# Patient Record
Sex: Female | Born: 1990 | Race: Black or African American | Hispanic: No | Marital: Married | Smoking: Never smoker
Health system: Southern US, Community
[De-identification: ages and names within clinical notes are randomized; demographics above are authoritative.]

## PROBLEM LIST (undated history)

## (undated) ENCOUNTER — Inpatient Hospital Stay (HOSPITAL_COMMUNITY): Payer: Self-pay

## (undated) DIAGNOSIS — IMO0002 Reserved for concepts with insufficient information to code with codable children: Secondary | ICD-10-CM

## (undated) DIAGNOSIS — L709 Acne, unspecified: Secondary | ICD-10-CM

## (undated) DIAGNOSIS — Z789 Other specified health status: Secondary | ICD-10-CM

## (undated) HISTORY — DX: Reserved for concepts with insufficient information to code with codable children: IMO0002

## (undated) HISTORY — DX: Other specified health status: Z78.9

## (undated) HISTORY — PX: NO PAST SURGERIES: SHX2092

---

## 2012-01-12 NOTE — L&D Delivery Note (Signed)
Delivery Note SROM @ 1520 w/ cervical FB still intact, at that time RN checked around balloon and was 3-4cm.  Pt decided to get an epidural ~1715, and at 1741 FB fell out, pt was ant lip and +2. At 1800 she was 10/100/+@ w/ strong urge to push, and at 6:37 PM a viable female was delivered via Vaginal, Spontaneous Delivery (Presentation: Right Occiput Anterior).  APGAR: 9, 9; weight 4 lb 13.8 oz (2206 g).   Placenta status: Intact, Spontaneous, small, to Pathology d/t IUGR.  Cord: 3 vessels with the following complications: None.   Uterus initially boggy, firmed up w/ massage.   Anesthesia: Epidural  Episiotomy: None Lacerations: 2nd degree Suture Repair: 3.0 vicryl Est. Blood Loss (mL): 400  Mom to postpartum.  Baby to Couplet care / Skin to Skin. Breastfeeding, undecided about contraception, plans OP circumcision  Marge Duncans 11/25/2012, 7:09 PM

## 2012-05-10 ENCOUNTER — Ambulatory Visit: Payer: Self-pay | Admitting: Family Medicine

## 2012-05-10 VITALS — BP 108/70 | HR 92 | Temp 98.5°F | Resp 16 | Ht 63.0 in | Wt 121.8 lb

## 2012-05-10 DIAGNOSIS — Z3201 Encounter for pregnancy test, result positive: Secondary | ICD-10-CM

## 2012-05-10 LAB — POCT URINE PREGNANCY: Preg Test, Ur: POSITIVE

## 2012-05-10 MED ORDER — PRENATAL VITAMINS (DIS) PO TABS: 1.0000 | ORAL_TABLET | Freq: Every morning | ORAL | Status: DC

## 2012-05-10 MED ORDER — PROMETHAZINE HCL 12.5 MG PO TABS: 12.5000 mg | ORAL_TABLET | Freq: Three times a day (TID) | ORAL | Status: DC | PRN

## 2012-05-10 NOTE — Patient Instructions (Addendum)

## 2012-05-10 NOTE — Progress Notes (Signed)
22 yo woman from [place] whose last period was February 28th.  She had a positive home pregnancy test. She has had morning sickness for two weeks.  Objective:  NAD  We discussed going to [facility] as an uninsured patient  Pregnancy test positive - Plan: POCT urine pregnancy, promethazine (PHENERGAN) 12.5 MG tablet, Prenatal Vitamins (DIS) TABS, DISCONTINUED: promethazine (PHENERGAN) 12.5 MG tablet, DISCONTINUED: Prenatal Vitamins (DIS) TABS

## 2012-05-29 LAB — OB RESULTS CONSOLE GC/CHLAMYDIA: Gonorrhea: NEGATIVE

## 2012-05-29 LAB — OB RESULTS CONSOLE RPR: RPR: NONREACTIVE

## 2012-05-29 LAB — OB RESULTS CONSOLE RUBELLA ANTIBODY, IGM: Rubella: IMMUNE

## 2012-05-29 LAB — OB RESULTS CONSOLE HIV ANTIBODY (ROUTINE TESTING): HIV: NONREACTIVE

## 2012-05-29 LAB — OB RESULTS CONSOLE ABO/RH

## 2012-05-30 ENCOUNTER — Other Ambulatory Visit: Payer: Self-pay | Admitting: Family

## 2012-05-30 DIAGNOSIS — N6324 Unspecified lump in the left breast, lower inner quadrant: Secondary | ICD-10-CM

## 2012-06-02 ENCOUNTER — Ambulatory Visit
Admission: RE | Admit: 2012-06-02 | Discharge: 2012-06-02 | Disposition: A | Discharge status: Home / Self Care | Payer: No Typology Code available for payment source | Source: Ambulatory Visit | Attending: Family | Admitting: Family

## 2012-06-02 ENCOUNTER — Other Ambulatory Visit: Payer: Self-pay | Admitting: Family

## 2012-06-02 DIAGNOSIS — N6324 Unspecified lump in the left breast, lower inner quadrant: Secondary | ICD-10-CM

## 2012-06-02 DIAGNOSIS — N63 Unspecified lump in unspecified breast: Secondary | ICD-10-CM

## 2012-07-12 ENCOUNTER — Other Ambulatory Visit (HOSPITAL_COMMUNITY): Payer: Self-pay | Admitting: Physician Assistant

## 2012-07-12 DIAGNOSIS — Z0489 Encounter for examination and observation for other specified reasons: Secondary | ICD-10-CM

## 2012-07-18 ENCOUNTER — Ambulatory Visit (HOSPITAL_COMMUNITY)
Admission: RE | Admit: 2012-07-18 | Discharge: 2012-07-18 | Disposition: A | Discharge status: Home / Self Care | Payer: Medicaid Other | Source: Ambulatory Visit | Attending: Physician Assistant | Admitting: Physician Assistant

## 2012-07-18 DIAGNOSIS — Z3689 Encounter for other specified antenatal screening: Secondary | ICD-10-CM | POA: Insufficient documentation

## 2012-07-18 DIAGNOSIS — Z0489 Encounter for examination and observation for other specified reasons: Secondary | ICD-10-CM

## 2012-09-06 ENCOUNTER — Other Ambulatory Visit (HOSPITAL_COMMUNITY): Payer: Self-pay | Admitting: Physician Assistant

## 2012-09-06 DIAGNOSIS — O288 Other abnormal findings on antenatal screening of mother: Secondary | ICD-10-CM

## 2012-09-06 DIAGNOSIS — Z364 Encounter for antenatal screening for fetal growth retardation: Secondary | ICD-10-CM

## 2012-09-08 ENCOUNTER — Ambulatory Visit (HOSPITAL_COMMUNITY)
Admission: RE | Admit: 2012-09-08 | Discharge: 2012-09-08 | Disposition: A | Discharge status: Home / Self Care | Payer: Medicaid Other | Source: Ambulatory Visit | Attending: Physician Assistant | Admitting: Physician Assistant

## 2012-09-08 DIAGNOSIS — Z3689 Encounter for other specified antenatal screening: Secondary | ICD-10-CM | POA: Insufficient documentation

## 2012-09-08 DIAGNOSIS — Z364 Encounter for antenatal screening for fetal growth retardation: Secondary | ICD-10-CM

## 2012-09-08 DIAGNOSIS — O288 Other abnormal findings on antenatal screening of mother: Secondary | ICD-10-CM

## 2012-09-08 DIAGNOSIS — O26849 Uterine size-date discrepancy, unspecified trimester: Secondary | ICD-10-CM | POA: Insufficient documentation

## 2012-09-13 ENCOUNTER — Other Ambulatory Visit (HOSPITAL_COMMUNITY): Payer: Self-pay | Admitting: Physician Assistant

## 2012-09-13 DIAGNOSIS — Z1389 Encounter for screening for other disorder: Secondary | ICD-10-CM

## 2012-09-13 DIAGNOSIS — O26849 Uterine size-date discrepancy, unspecified trimester: Secondary | ICD-10-CM

## 2012-09-28 LAB — OB RESULTS CONSOLE RPR: RPR: NONREACTIVE

## 2012-09-28 LAB — OB RESULTS CONSOLE HGB/HCT, BLOOD
HCT: 30 %
Hemoglobin: 9.9 g/dL

## 2012-09-28 LAB — OB RESULTS CONSOLE HIV ANTIBODY (ROUTINE TESTING): HIV: NONREACTIVE

## 2012-09-28 LAB — GLUCOSE TOLERANCE, 1 HOUR (50G) W/O FASTING: Glucose, 1 Hour GTT: 151

## 2012-09-29 LAB — GLUCOSE TOLERANCE, 3 HOURS
Glucose, GTT - 3 Hour: 120 mg/dL (ref ?–140)
Glucose, GTT - Fasting: 84 mg/dL (ref 80–110)

## 2012-10-06 ENCOUNTER — Other Ambulatory Visit (HOSPITAL_COMMUNITY): Payer: Self-pay | Admitting: Physician Assistant

## 2012-10-06 ENCOUNTER — Ambulatory Visit (HOSPITAL_COMMUNITY)
Admission: RE | Admit: 2012-10-06 | Discharge: 2012-10-06 | Disposition: A | Discharge status: Home / Self Care | Payer: Medicaid Other | Source: Ambulatory Visit | Attending: Physician Assistant | Admitting: Physician Assistant

## 2012-10-06 ENCOUNTER — Ambulatory Visit (HOSPITAL_COMMUNITY)
Admission: RE | Admit: 2012-10-06 | Payer: Medicaid Other | Source: Ambulatory Visit | Attending: Obstetrics & Gynecology | Admitting: Obstetrics & Gynecology

## 2012-10-06 DIAGNOSIS — Z1389 Encounter for screening for other disorder: Secondary | ICD-10-CM

## 2012-10-06 DIAGNOSIS — O26849 Uterine size-date discrepancy, unspecified trimester: Secondary | ICD-10-CM | POA: Insufficient documentation

## 2012-10-12 ENCOUNTER — Other Ambulatory Visit (HOSPITAL_COMMUNITY): Payer: Self-pay | Admitting: Physician Assistant

## 2012-10-12 DIAGNOSIS — O26849 Uterine size-date discrepancy, unspecified trimester: Secondary | ICD-10-CM

## 2012-10-27 ENCOUNTER — Encounter (HOSPITAL_COMMUNITY): Payer: Self-pay

## 2012-10-27 ENCOUNTER — Ambulatory Visit (HOSPITAL_COMMUNITY)
Admission: RE | Admit: 2012-10-27 | Discharge: 2012-10-27 | Disposition: A | Discharge status: Home / Self Care | Payer: Medicaid Other | Source: Ambulatory Visit | Attending: Physician Assistant | Admitting: Physician Assistant

## 2012-10-27 ENCOUNTER — Other Ambulatory Visit (HOSPITAL_COMMUNITY): Payer: Self-pay | Admitting: Physician Assistant

## 2012-10-27 DIAGNOSIS — O26849 Uterine size-date discrepancy, unspecified trimester: Secondary | ICD-10-CM | POA: Insufficient documentation

## 2012-10-30 ENCOUNTER — Other Ambulatory Visit (HOSPITAL_COMMUNITY): Payer: Self-pay | Admitting: Physician Assistant

## 2012-10-30 ENCOUNTER — Ambulatory Visit (HOSPITAL_COMMUNITY): Admission: RE | Admit: 2012-10-30 | Payer: Medicaid Other | Source: Ambulatory Visit

## 2012-10-30 ENCOUNTER — Ambulatory Visit (HOSPITAL_COMMUNITY)
Admission: RE | Admit: 2012-10-30 | Discharge: 2012-10-30 | Disposition: A | Discharge status: Home / Self Care | Payer: Medicaid Other | Source: Ambulatory Visit | Attending: Physician Assistant | Admitting: Physician Assistant

## 2012-10-30 VITALS — BP 114/68 | HR 98 | Wt 143.0 lb

## 2012-10-30 DIAGNOSIS — Z3689 Encounter for other specified antenatal screening: Secondary | ICD-10-CM | POA: Insufficient documentation

## 2012-10-30 DIAGNOSIS — O26849 Uterine size-date discrepancy, unspecified trimester: Secondary | ICD-10-CM

## 2012-10-30 NOTE — Progress Notes (Signed)
Shelley Bishop  was seen today for an ultrasound appointment.  See full report in [facility].  Impression: Single IUP at 33 3/7  Suspected fetal growth restriction (EFW < 10th %tile at last visit) Active fetus with BPP of 8/8 Normal amniotic fluid volume  Recommendations: Continue 2x weekly NSTs with weekly AFI, UA Dopplers Follow up growth scan in 2-3 weeks.  Alpha Gula, MD

## 2012-10-31 ENCOUNTER — Other Ambulatory Visit (HOSPITAL_COMMUNITY): Payer: Self-pay | Admitting: Physician Assistant

## 2012-10-31 DIAGNOSIS — IMO0002 Reserved for concepts with insufficient information to code with codable children: Secondary | ICD-10-CM

## 2012-10-31 NOTE — Addendum Note (Signed)
Encounter addended by: Alessandra Bevels. Chase Picket, RN on: 10/31/2012  4:42 PM<BR>     Documentation filed: Charges VN, Episodes, Chief Complaint Section

## 2012-11-02 ENCOUNTER — Ambulatory Visit (HOSPITAL_COMMUNITY)
Admission: RE | Admit: 2012-11-02 | Discharge: 2012-11-02 | Disposition: A | Discharge status: Home / Self Care | Payer: Medicaid Other | Source: Ambulatory Visit | Attending: Physician Assistant | Admitting: Physician Assistant

## 2012-11-02 DIAGNOSIS — O365990 Maternal care for other known or suspected poor fetal growth, unspecified trimester, not applicable or unspecified: Secondary | ICD-10-CM | POA: Insufficient documentation

## 2012-11-02 DIAGNOSIS — Z3689 Encounter for other specified antenatal screening: Secondary | ICD-10-CM | POA: Insufficient documentation

## 2012-11-02 DIAGNOSIS — IMO0002 Reserved for concepts with insufficient information to code with codable children: Secondary | ICD-10-CM

## 2012-11-06 ENCOUNTER — Ambulatory Visit (HOSPITAL_COMMUNITY)
Admission: RE | Admit: 2012-11-06 | Discharge: 2012-11-06 | Disposition: A | Discharge status: Home / Self Care | Payer: Medicaid Other | Source: Ambulatory Visit | Attending: Physician Assistant | Admitting: Physician Assistant

## 2012-11-06 ENCOUNTER — Other Ambulatory Visit (HOSPITAL_COMMUNITY): Payer: Self-pay | Admitting: Physician Assistant

## 2012-11-06 ENCOUNTER — Ambulatory Visit (HOSPITAL_COMMUNITY): Admission: RE | Admit: 2012-11-06 | Payer: Medicaid Other | Source: Ambulatory Visit

## 2012-11-06 VITALS — BP 117/6 | HR 104 | Wt 146.0 lb

## 2012-11-06 DIAGNOSIS — Z3689 Encounter for other specified antenatal screening: Secondary | ICD-10-CM | POA: Insufficient documentation

## 2012-11-06 DIAGNOSIS — O0990 Supervision of high risk pregnancy, unspecified, unspecified trimester: Secondary | ICD-10-CM

## 2012-11-06 DIAGNOSIS — O365921 Maternal care for other known or suspected poor fetal growth, second trimester, fetus 1: Secondary | ICD-10-CM

## 2012-11-06 DIAGNOSIS — O365990 Maternal care for other known or suspected poor fetal growth, unspecified trimester, not applicable or unspecified: Secondary | ICD-10-CM | POA: Insufficient documentation

## 2012-11-07 ENCOUNTER — Other Ambulatory Visit (HOSPITAL_COMMUNITY): Payer: Self-pay | Admitting: Physician Assistant

## 2012-11-07 DIAGNOSIS — IMO0002 Reserved for concepts with insufficient information to code with codable children: Secondary | ICD-10-CM

## 2012-11-09 ENCOUNTER — Ambulatory Visit (HOSPITAL_COMMUNITY)
Admission: RE | Admit: 2012-11-09 | Discharge: 2012-11-09 | Disposition: A | Discharge status: Home / Self Care | Payer: Medicaid Other | Source: Ambulatory Visit | Attending: Physician Assistant | Admitting: Physician Assistant

## 2012-11-09 DIAGNOSIS — O365990 Maternal care for other known or suspected poor fetal growth, unspecified trimester, not applicable or unspecified: Secondary | ICD-10-CM | POA: Insufficient documentation

## 2012-11-09 DIAGNOSIS — IMO0002 Reserved for concepts with insufficient information to code with codable children: Secondary | ICD-10-CM

## 2012-11-13 ENCOUNTER — Ambulatory Visit (HOSPITAL_COMMUNITY)
Admission: RE | Admit: 2012-11-13 | Discharge: 2012-11-13 | Disposition: A | Discharge status: Home / Self Care | Payer: Medicaid Other | Source: Ambulatory Visit | Attending: Obstetrics & Gynecology | Admitting: Obstetrics & Gynecology

## 2012-11-13 ENCOUNTER — Encounter: Payer: Medicaid Other | Admitting: Obstetrics & Gynecology

## 2012-11-13 DIAGNOSIS — O365990 Maternal care for other known or suspected poor fetal growth, unspecified trimester, not applicable or unspecified: Secondary | ICD-10-CM | POA: Insufficient documentation

## 2012-11-13 DIAGNOSIS — IMO0002 Reserved for concepts with insufficient information to code with codable children: Secondary | ICD-10-CM

## 2012-11-16 ENCOUNTER — Other Ambulatory Visit (HOSPITAL_COMMUNITY): Payer: Self-pay | Admitting: Physician Assistant

## 2012-11-16 ENCOUNTER — Ambulatory Visit (HOSPITAL_COMMUNITY)
Admission: RE | Admit: 2012-11-16 | Discharge: 2012-11-16 | Disposition: A | Discharge status: Home / Self Care | Payer: Medicaid Other | Source: Ambulatory Visit | Attending: Obstetrics & Gynecology | Admitting: Obstetrics & Gynecology

## 2012-11-16 ENCOUNTER — Ambulatory Visit (HOSPITAL_COMMUNITY): Admission: RE | Admit: 2012-11-16 | Payer: Medicaid Other | Source: Ambulatory Visit

## 2012-11-16 ENCOUNTER — Other Ambulatory Visit (HOSPITAL_COMMUNITY): Payer: Self-pay | Admitting: Maternal and Fetal Medicine

## 2012-11-16 DIAGNOSIS — O365990 Maternal care for other known or suspected poor fetal growth, unspecified trimester, not applicable or unspecified: Secondary | ICD-10-CM | POA: Insufficient documentation

## 2012-11-16 DIAGNOSIS — Z3689 Encounter for other specified antenatal screening: Secondary | ICD-10-CM | POA: Insufficient documentation

## 2012-11-16 DIAGNOSIS — O26849 Uterine size-date discrepancy, unspecified trimester: Secondary | ICD-10-CM

## 2012-11-16 DIAGNOSIS — IMO0002 Reserved for concepts with insufficient information to code with codable children: Secondary | ICD-10-CM

## 2012-11-20 ENCOUNTER — Ambulatory Visit (HOSPITAL_COMMUNITY)
Admission: RE | Admit: 2012-11-20 | Discharge: 2012-11-20 | Disposition: A | Discharge status: Home / Self Care | Payer: Medicaid Other | Source: Ambulatory Visit

## 2012-11-20 ENCOUNTER — Ambulatory Visit (INDEPENDENT_AMBULATORY_CARE_PROVIDER_SITE_OTHER): Payer: Medicaid Other | Admitting: Family Medicine

## 2012-11-20 ENCOUNTER — Encounter: Payer: Self-pay | Admitting: Family Medicine

## 2012-11-20 ENCOUNTER — Ambulatory Visit (HOSPITAL_COMMUNITY)
Admission: RE | Admit: 2012-11-20 | Discharge: 2012-11-20 | Disposition: A | Discharge status: Home / Self Care | Payer: Medicaid Other | Source: Ambulatory Visit | Attending: Physician Assistant | Admitting: Physician Assistant

## 2012-11-20 ENCOUNTER — Other Ambulatory Visit (HOSPITAL_COMMUNITY): Payer: Medicaid Other

## 2012-11-20 ENCOUNTER — Encounter (HOSPITAL_COMMUNITY): Payer: Self-pay

## 2012-11-20 VITALS — BP 118/72

## 2012-11-20 VITALS — BP 122/72 | Temp 98.3°F | Wt 147.9 lb

## 2012-11-20 DIAGNOSIS — O365990 Maternal care for other known or suspected poor fetal growth, unspecified trimester, not applicable or unspecified: Secondary | ICD-10-CM | POA: Insufficient documentation

## 2012-11-20 DIAGNOSIS — O365931 Maternal care for other known or suspected poor fetal growth, third trimester, fetus 1: Secondary | ICD-10-CM

## 2012-11-20 DIAGNOSIS — Z8759 Personal history of other complications of pregnancy, childbirth and the puerperium: Secondary | ICD-10-CM | POA: Insufficient documentation

## 2012-11-20 DIAGNOSIS — O0990 Supervision of high risk pregnancy, unspecified, unspecified trimester: Secondary | ICD-10-CM

## 2012-11-20 DIAGNOSIS — IMO0002 Reserved for concepts with insufficient information to code with codable children: Secondary | ICD-10-CM

## 2012-11-20 LAB — POCT URINALYSIS DIP (DEVICE)
Glucose, UA: NEGATIVE mg/dL
Ketones, ur: NEGATIVE mg/dL
Nitrite: NEGATIVE
Protein, ur: NEGATIVE mg/dL
Specific Gravity, Urine: 1.02 (ref 1.005–1.030)
pH: 7 (ref 5.0–8.0)

## 2012-11-20 LAB — OB RESULTS CONSOLE GBS: GBS: NEGATIVE

## 2012-11-20 NOTE — Progress Notes (Signed)
IOL scheduled 11/24/12 at 730 pm.

## 2012-11-20 NOTE — Patient Instructions (Signed)
Contraception Choices Contraception (birth control) is the use of any methods or devices to prevent pregnancy. Below are some methods to help avoid pregnancy. HORMONAL METHODS   Contraceptive implant This is a thin, plastic tube containing progesterone hormone. It does not contain estrogen hormone. Your health care provider inserts the tube in the inner part of the upper arm. The tube can remain in place for up to 3 years. After 3 years, the implant must be removed. The implant prevents the ovaries from releasing an egg (ovulation), thickens the cervical mucus to prevent sperm from entering the uterus, and thins the lining of the inside of the uterus.  Progesterone-only injections These injections are given every 3 months by your health care provider to prevent pregnancy. This synthetic progesterone hormone stops the ovaries from releasing eggs. It also thickens cervical mucus and changes the uterine lining. This makes it harder for sperm to survive in the uterus.  Birth control pills These pills contain estrogen and progesterone hormone. They work by preventing the ovaries from releasing eggs (ovulation). They also cause the cervical mucus to thicken, preventing the sperm from entering the uterus. Birth control pills are prescribed by a health care provider.Birth control pills can also be used to treat heavy periods.  Minipill This type of birth control pill contains only the progesterone hormone. They are taken every day of each month and must be prescribed by your health care provider.  Birth control patch The patch contains hormones similar to those in birth control pills. It must be changed once a week and is prescribed by a health care provider.  Vaginal ring The ring contains hormones similar to those in birth control pills. It is left in the vagina for 3 weeks, removed for 1 week, and then a new one is put back in place. The patient must be comfortable inserting and removing the ring from  the vagina.A health care provider's prescription is necessary.  Emergency contraception Emergency contraceptives prevent pregnancy after unprotected sexual intercourse. This pill can be taken right after sex or up to 5 days after unprotected sex. It is most effective the sooner you take the pills after having sexual intercourse. Most emergency contraceptive pills are available without a prescription. Check with your pharmacist. Do not use emergency contraception as your only form of birth control. BARRIER METHODS   Female condom This is a thin sheath (latex or rubber) that is worn over the penis during sexual intercourse. It can be used with spermicide to increase effectiveness.  Female condom. This is a soft, loose-fitting sheath that is put into the vagina before sexual intercourse.  Diaphragm This is a soft, latex, dome-shaped barrier that must be fitted by a health care provider. It is inserted into the vagina, along with a spermicidal jelly. It is inserted before intercourse. The diaphragm should be left in the vagina for 6 to 8 hours after intercourse.  Cervical cap This is a round, soft, latex or plastic cup that fits over the cervix and must be fitted by a health care provider. The cap can be left in place for up to 48 hours after intercourse.  Sponge This is a soft, circular piece of polyurethane foam. The sponge has spermicide in it. It is inserted into the vagina after wetting it and before sexual intercourse.  Spermicides These are chemicals that kill or block sperm from entering the cervix and uterus. They come in the form of creams, jellies, suppositories, foam, or tablets. They do not require a   prescription. They are inserted into the vagina with an applicator before having sexual intercourse. The process must be repeated every time you have sexual intercourse. INTRAUTERINE CONTRACEPTION  Intrauterine device (IUD) This is a T-shaped device that is put in a woman's uterus during a  menstrual period to prevent pregnancy. There are 2 types:  Copper IUD This type of IUD is wrapped in copper wire and is placed inside the uterus. Copper makes the uterus and fallopian tubes produce a fluid that kills sperm. It can stay in place for 10 years.  Hormone IUD This type of IUD contains the hormone progestin (synthetic progesterone). The hormone thickens the cervical mucus and prevents sperm from entering the uterus, and it also thins the uterine lining to prevent implantation of a fertilized egg. The hormone can weaken or kill the sperm that get into the uterus. It can stay in place for 3 5 years, depending on which type of IUD is used. PERMANENT METHODS OF CONTRACEPTION  Female tubal ligation This is when the woman's fallopian tubes are surgically sealed, tied, or blocked to prevent the egg from traveling to the uterus.  Hysteroscopic sterilization This involves placing a small coil or insert into each fallopian tube. Your doctor uses a technique called hysteroscopy to do the procedure. The device causes scar tissue to form. This results in permanent blockage of the fallopian tubes, so the sperm cannot fertilize the egg. It takes about 3 months after the procedure for the tubes to become blocked. You must use another form of birth control for these 3 months.  Female sterilization This is when the female has the tubes that carry sperm tied off (vasectomy).This blocks sperm from entering the vagina during sexual intercourse. After the procedure, the man can still ejaculate fluid (semen). NATURAL PLANNING METHODS  Natural family planning This is not having sexual intercourse or using a barrier method (condom, diaphragm, cervical cap) on days the woman could become pregnant.  Calendar method This is keeping track of the length of each menstrual cycle and identifying when you are fertile.  Ovulation method This is avoiding sexual intercourse during ovulation.  Symptothermal method This is  avoiding sexual intercourse during ovulation, using a thermometer and ovulation symptoms.  Post ovulation method This is timing sexual intercourse after you have ovulated. Regardless of which type or method of contraception you choose, it is important that you use condoms to protect against the transmission of sexually transmitted infections (STIs). Talk with your health care provider about which form of contraception is most appropriate for you. Document Released: 12/28/2004 Document Revised: 08/30/2012 Document Reviewed: 06/22/2012 [facility] Patient Information 2014 [place], [place].  Breastfeeding Deciding to breastfeed is one of the best choices you can make for you and your baby. A change in hormones during pregnancy causes your breast tissue to grow and increases the number and size of your milk ducts. These hormones also allow proteins, sugars, and fats from your blood supply to make breast milk in your milk-producing glands. Hormones prevent breast milk from being released before your baby is born as well as prompt milk flow after birth. Once breastfeeding has begun, thoughts of your baby, as well as his or her sucking or crying, can stimulate the release of milk from your milk-producing glands.  BENEFITS OF BREASTFEEDING For Your Baby  Your first milk (colostrum) helps your baby's digestive system function better.   There are antibodies in your milk that help your baby fight off infections.   Your baby has a  lower incidence of asthma, allergies, and sudden infant death syndrome.   The nutrients in breast milk are better for your baby than infant formulas and are designed uniquely for your baby's needs.   Breast milk improves your baby's brain development.   Your baby is less likely to develop other conditions, such as childhood obesity, asthma, or type 2 diabetes mellitus.  For You   Breastfeeding helps to create a very special bond between you and your baby.   Breastfeeding  is convenient. Breast milk is always available at the correct temperature and costs nothing.   Breastfeeding helps to burn calories and helps you lose the weight gained during pregnancy.   Breastfeeding makes your uterus contract to its prepregnancy size faster and slows bleeding (lochia) after you give birth.   Breastfeeding helps to lower your risk of developing type 2 diabetes mellitus, osteoporosis, and breast or ovarian cancer later in life. SIGNS THAT YOUR BABY IS HUNGRY Early Signs of Hunger  Increased alertness or activity.  Stretching.  Movement of the head from side to side.  Movement of the head and opening of the mouth when the corner of the mouth or cheek is stroked (rooting).  Increased sucking sounds, smacking lips, cooing, sighing, or squeaking.  Hand-to-mouth movements.  Increased sucking of fingers or hands. Late Signs of Hunger  Fussing.  Intermittent crying. Extreme Signs of Hunger Signs of extreme hunger will require calming and consoling before your baby will be able to breastfeed successfully. Do not wait for the following signs of extreme hunger to occur before you initiate breastfeeding:   Restlessness.  A loud, strong cry.   Screaming. BREASTFEEDING BASICS Breastfeeding Initiation  Find a comfortable place to sit or lie down, with your neck and back well supported.  Place a pillow or rolled up blanket under your baby to bring him or her to the level of your breast (if you are seated). Nursing pillows are specially designed to help support your arms and your baby while you breastfeed.  Make sure that your baby's abdomen is facing your abdomen.   Gently massage your breast. With your fingertips, massage from your chest wall toward your nipple in a circular motion. This encourages milk flow. You may need to continue this action during the feeding if your milk flows slowly.  Support your breast with 4 fingers underneath and your thumb above  your nipple. Make sure your fingers are well away from your nipple and your baby's mouth.   Stroke your baby's lips gently with your finger or nipple.   When your baby's mouth is open wide enough, quickly bring your baby to your breast, placing your entire nipple and as much of the colored area around your nipple (areola) as possible into your baby's mouth.   More areola should be visible above your baby's upper lip than below the lower lip.   Your baby's tongue should be between his or her lower gum and your breast.   Ensure that your baby's mouth is correctly positioned around your nipple (latched). Your baby's lips should create a seal on your breast and be turned out (everted).  It is common for your baby to suck about 2 3 minutes in order to start the flow of breast milk. Latching Teaching your baby how to latch on to your breast properly is very important. An improper latch can cause nipple pain and decreased milk supply for you and poor weight gain in your baby. Also, if your baby is  not latched onto your nipple properly, he or she may swallow some air during feeding. This can make your baby fussy. Burping your baby when you switch breasts during the feeding can help to get rid of the air. However, teaching your baby to latch on properly is still the best way to prevent fussiness from swallowing air while breastfeeding. Signs that your baby has successfully latched on to your nipple:    Silent tugging or silent sucking, without causing you pain.   Swallowing heard between every 3 4 sucks.    Muscle movement above and in front of his or her ears while sucking.  Signs that your baby has not successfully latched on to nipple:   Sucking sounds or smacking sounds from your baby while breastfeeding.  Nipple pain. If you think your baby has not latched on correctly, slip your finger into the corner of your baby's mouth to break the suction and place it between your baby's gums.  Attempt breastfeeding initiation again. Signs of Successful Breastfeeding Signs from your baby:   A gradual decrease in the number of sucks or complete cessation of sucking.   Falling asleep.   Relaxation of his or her body.   Retention of a small amount of milk in his or her mouth.   Letting go of your breast by himself or herself. Signs from you:  Breasts that have increased in firmness, weight, and size 1 3 hours after feeding.   Breasts that are softer immediately after breastfeeding.  Increased milk volume, as well as a change in milk consistency and color by the 5th day of breastfeeding.   Nipples that are not sore, cracked, or bleeding. Signs That Your Pecola Leisure is Getting Enough Milk  Wetting at least 3 diapers in a 24-hour period. The urine should be clear and pale yellow by age 24 days.  At least 3 stools in a 24-hour period by age 24 days. The stool should be soft and yellow.  At least 3 stools in a 24-hour period by age 375 days. The stool should be seedy and yellow.  No loss of weight greater than 10% of birth weight during the first 65 days of age.  Average weight gain of 4 7 ounces (120 210 mL) per week after age 37 days.  Consistent daily weight gain by age 24 days, without weight loss after the age of 2 weeks. After a feeding, your baby may spit up a small amount. This is common. BREASTFEEDING FREQUENCY AND DURATION Frequent feeding will help you make more milk and can prevent sore nipples and breast engorgement. Breastfeed when you feel the need to reduce the fullness of your breasts or when your baby shows signs of hunger. This is called "breastfeeding on demand." Avoid introducing a pacifier to your baby while you are working to establish breastfeeding (the first 4 6 weeks after your baby is born). After this time you may choose to use a pacifier. Research has shown that pacifier use during the first year of a baby's life decreases the risk of sudden infant death  syndrome (SIDS). Allow your baby to feed on each breast as long as he or she wants. Breastfeed until your baby is finished feeding. When your baby unlatches or falls asleep while feeding from the first breast, offer the second breast. Because newborns are often sleepy in the first few weeks of life, you may need to awaken your baby to get him or her to feed. Breastfeeding times will vary from  baby to baby. However, the following rules can serve as a guide to help you ensure that your baby is properly fed:  Newborns (babies 31 weeks of age or younger) may breastfeed every 1 3 hours.  Newborns should not go longer than 3 hours during the day or 5 hours during the night without breastfeeding.  You should breastfeed your baby a minimum of 8 times in a 24-hour period until you begin to introduce solid foods to your baby at around 40 months of age. BREAST MILK PUMPING Pumping and storing breast milk allows you to ensure that your baby is exclusively fed your breast milk, even at times when you are unable to breastfeed. This is especially important if you are going back to work while you are still breastfeeding or when you are not able to be present during feedings. Your lactation consultant can give you guidelines on how long it is safe to store breast milk.  A breast pump is a machine that allows you to pump milk from your breast into a sterile bottle. The pumped breast milk can then be stored in a refrigerator or freezer. Some breast pumps are operated by hand, while others use electricity. Ask your lactation consultant which type will work best for you. Breast pumps can be purchased, but some hospitals and breastfeeding support groups lease breast pumps on a monthly basis. A lactation consultant can teach you how to hand express breast milk, if you prefer not to use a pump.  CARING FOR YOUR BREASTS WHILE YOU BREASTFEED Nipples can become dry, cracked, and sore while breastfeeding. The following  recommendations can help keep your breasts moisturized and healthy:  Avoid using soap on your nipples.   Wear a supportive bra. Although not required, special nursing bras and tank tops are designed to allow access to your breasts for breastfeeding without taking off your entire bra or top. Avoid wearing underwire style bras or extremely tight bras.  Air dry your nipples for 3 after each feeding.   Use only cotton bra pads to absorb leaked breast milk. Leaking of breast milk between feedings is normal.   Use lanolin on your nipples after breastfeeding. Lanolin helps to maintain your skin's normal moisture barrier. If you use pure lanolin you do not need to wash it off before feeding your baby again. Pure lanolin is not toxic to your baby. You may also hand express a few drops of breast milk and gently massage that milk into your nipples and allow the milk to air dry. In the first few weeks after giving birth, some women experience extremely full breasts (engorgement). Engorgement can make your breasts feel heavy, warm, and tender to the touch. Engorgement peaks within 3 5 days after you give birth. The following recommendations can help ease engorgement:  Completely empty your breasts while breastfeeding or pumping. You may want to start by applying warm, moist heat (in the shower or with warm water-soaked hand towels) just before feeding or pumping. This increases circulation and helps the milk flow. If your baby does not completely empty your breasts while breastfeeding, pump any extra milk after he or she is finished.  Wear a snug bra (nursing or regular) or tank top for 1 2 days to signal your body to slightly decrease milk production.  Apply ice packs to your breasts, unless this is too uncomfortable for you.  Make sure that your baby is latched on and positioned properly while breastfeeding. If engorgement persists after 48 hours  of following these recommendations, contact your  health care provider or a Advertising copywriter. OVERALL HEALTH CARE RECOMMENDATIONS WHILE BREASTFEEDING  Eat healthy foods. Alternate between meals and snacks, eating 3 of each per day. Because what you eat affects your breast milk, some of the foods may make your baby more irritable than usual. Avoid eating these foods if you are sure that they are negatively affecting your baby.  Drink milk, fruit juice, and water to satisfy your thirst (about 10 glasses a day).   Rest often, relax, and continue to take your prenatal vitamins to prevent fatigue, stress, and anemia.  Continue breast self-awareness checks.  Avoid chewing and smoking tobacco.  Avoid alcohol and drug use. Some medicines that may be harmful to your baby can pass through breast milk. It is important to ask your health care provider before taking any medicine, including all over-the-counter and prescription medicine as well as vitamin and herbal supplements. It is possible to become pregnant while breastfeeding. If birth control is desired, ask your health care provider about options that will be safe for your baby. SEEK MEDICAL CARE IF:   You feel like you want to stop breastfeeding or have become frustrated with breastfeeding.  You have painful breasts or nipples.  Your nipples are cracked or bleeding.  Your breasts are red, tender, or warm.  You have a swollen area on either breast.  You have a fever or chills.  You have nausea or vomiting.  You have drainage other than breast milk from your nipples.  Your breasts do not become full before feedings by the 5th day after you give birth.  You feel sad and depressed.  Your baby is too sleepy to eat well.  Your baby is having trouble sleeping.   Your baby is wetting less than 3 diapers in a 24-hour period.  Your baby has less than 3 stools in a 24-hour period.  Your baby's skin or the white part of his or her eyes becomes yellow.   Your baby is not gaining  weight by 49 days of age. SEEK IMMEDIATE MEDICAL CARE IF:   Your baby is overly tired (lethargic) and does not want to wake up and feed.  Your baby develops an unexplained fever. Document Released: 12/28/2004 Document Revised: 08/30/2012 Document Reviewed: 06/21/2012 [facility] Patient Information 2014 [place], [place].

## 2012-11-20 NOTE — Progress Notes (Signed)
Pulse- 85  New ob packet given Tdap and flu vaccine given @ [facility]

## 2012-11-20 NOTE — Progress Notes (Signed)
New OB transfer from [facility] with IUGR--per MFM for delivery at 37 wks.--will schedule. Cultures today--2x/wk testing--to be done in MFM today--for delivery at time of next NST.

## 2012-11-21 ENCOUNTER — Encounter (HOSPITAL_COMMUNITY): Payer: Self-pay | Admitting: *Deleted

## 2012-11-21 ENCOUNTER — Telehealth (HOSPITAL_COMMUNITY): Payer: Self-pay | Admitting: *Deleted

## 2012-11-21 NOTE — Telephone Encounter (Signed)
Preadmission screen  

## 2012-11-22 ENCOUNTER — Other Ambulatory Visit: Payer: Self-pay

## 2012-11-22 ENCOUNTER — Other Ambulatory Visit (HOSPITAL_COMMUNITY): Payer: Self-pay | Admitting: Physician Assistant

## 2012-11-22 ENCOUNTER — Encounter: Payer: Self-pay | Admitting: *Deleted

## 2012-11-22 DIAGNOSIS — IMO0002 Reserved for concepts with insufficient information to code with codable children: Secondary | ICD-10-CM

## 2012-11-23 ENCOUNTER — Ambulatory Visit (HOSPITAL_COMMUNITY)
Admission: RE | Admit: 2012-11-23 | Discharge: 2012-11-23 | Disposition: A | Discharge status: Home / Self Care | Payer: Medicaid Other | Source: Ambulatory Visit | Attending: Physician Assistant | Admitting: Physician Assistant

## 2012-11-23 ENCOUNTER — Encounter: Payer: Self-pay | Admitting: Family Medicine

## 2012-11-23 DIAGNOSIS — O365990 Maternal care for other known or suspected poor fetal growth, unspecified trimester, not applicable or unspecified: Principal | ICD-10-CM | POA: Diagnosis present

## 2012-11-23 NOTE — Progress Notes (Signed)
Shelley Bishop  was seen today for an ultrasound appointment.  See full report in [facility].  Impression: Single living intrauterine pregnancy at 36 weeks 6 days. Fetal growth restriction. Normal amniotic fluid volume. Reactive NST - normal modified BPP  Recommendations: Patient scheduled for induction of labor tomorrow  Alpha Gula, MD

## 2012-11-23 NOTE — ED Notes (Signed)
Patient placed on EFM for NST

## 2012-11-24 ENCOUNTER — Inpatient Hospital Stay (HOSPITAL_COMMUNITY)
Admission: RE | Admit: 2012-11-24 | Discharge: 2012-11-27 | DRG: 775 | Disposition: A | Discharge status: Home / Self Care | Payer: Medicaid Other | Source: Ambulatory Visit | Attending: Obstetrics & Gynecology | Admitting: Obstetrics & Gynecology

## 2012-11-24 VITALS — BP 114/67 | HR 81 | Temp 99.1°F | Resp 17 | Ht 63.0 in | Wt 151.0 lb

## 2012-11-24 DIAGNOSIS — O365991 Maternal care for other known or suspected poor fetal growth, unspecified trimester, fetus 1: Secondary | ICD-10-CM

## 2012-11-24 DIAGNOSIS — O0990 Supervision of high risk pregnancy, unspecified, unspecified trimester: Secondary | ICD-10-CM

## 2012-11-24 LAB — CBC
HCT: 33.6 % — ABNORMAL LOW (ref 36.0–46.0)
MCHC: 33.6 g/dL (ref 30.0–36.0)
MCV: 82.6 fL (ref 78.0–100.0)
Platelets: 301 10*3/uL (ref 150–400)
RBC: 4.07 MIL/uL (ref 3.87–5.11)
RDW: 14 % (ref 11.5–15.5)
WBC: 10.4 10*3/uL (ref 4.0–10.5)

## 2012-11-24 MED ORDER — LACTATED RINGERS IV SOLN
500.0000 mL | INTRAVENOUS | Status: DC | PRN
  Administered 2012-11-25: 500 mL via INTRAVENOUS

## 2012-11-24 MED ORDER — FLEET ENEMA 7-19 GM/118ML RE ENEM: 1.0000 | ENEMA | RECTAL | Status: DC | PRN

## 2012-11-24 MED ORDER — LACTATED RINGERS IV SOLN
INTRAVENOUS | Status: DC
  Administered 2012-11-25 (×2): via INTRAVENOUS

## 2012-11-24 MED ORDER — SODIUM CHLORIDE 0.9 % IJ SOLN: 3.0000 mL | INTRAMUSCULAR | Status: DC | PRN

## 2012-11-24 MED ORDER — MISOPROSTOL 25 MCG QUARTER TABLET
25.0000 ug | ORAL_TABLET | ORAL | Status: DC | PRN
  Administered 2012-11-24: 25 ug via VAGINAL
  Filled 2012-11-24: qty 1
  Filled 2012-11-24: qty 0.25

## 2012-11-24 MED ORDER — TERBUTALINE SULFATE 1 MG/ML IJ SOLN: 0.2500 mg | Freq: Once | INTRAMUSCULAR | Status: AC | PRN

## 2012-11-24 MED ORDER — CITRIC ACID-SODIUM CITRATE 334-500 MG/5ML PO SOLN: 30.0000 mL | ORAL | Status: DC | PRN

## 2012-11-24 MED ORDER — ONDANSETRON HCL 4 MG/2ML IJ SOLN: 4.0000 mg | Freq: Four times a day (QID) | INTRAMUSCULAR | Status: DC | PRN

## 2012-11-24 MED ORDER — OXYTOCIN 40 UNITS IN LACTATED RINGERS INFUSION - SIMPLE MED
62.5000 mL/h | INTRAVENOUS | Status: DC
  Administered 2012-11-25: 62.5 mL/h via INTRAVENOUS
  Filled 2012-11-24: qty 1000

## 2012-11-24 MED ORDER — IBUPROFEN 600 MG PO TABS: 600.0000 mg | ORAL_TABLET | Freq: Four times a day (QID) | ORAL | Status: DC | PRN

## 2012-11-24 MED ORDER — LIDOCAINE HCL (PF) 1 % IJ SOLN
30.0000 mL | INTRAMUSCULAR | Status: DC | PRN
  Filled 2012-11-24: qty 30

## 2012-11-24 MED ORDER — SODIUM CHLORIDE 0.9 % IV SOLN: 250.0000 mL | INTRAVENOUS | Status: DC | PRN

## 2012-11-24 MED ORDER — ACETAMINOPHEN 325 MG PO TABS: 650.0000 mg | ORAL_TABLET | ORAL | Status: DC | PRN

## 2012-11-24 MED ORDER — OXYTOCIN BOLUS FROM INFUSION: 500.0000 mL | INTRAVENOUS | Status: DC

## 2012-11-24 MED ORDER — OXYCODONE-ACETAMINOPHEN 5-325 MG PO TABS: 1.0000 | ORAL_TABLET | ORAL | Status: DC | PRN

## 2012-11-24 MED ORDER — SODIUM CHLORIDE 0.9 % IJ SOLN: 3.0000 mL | Freq: Two times a day (BID) | INTRAMUSCULAR | Status: DC

## 2012-11-24 MED ORDER — ZOLPIDEM TARTRATE 5 MG PO TABS: 5.0000 mg | ORAL_TABLET | Freq: Every evening | ORAL | Status: DC | PRN

## 2012-11-25 ENCOUNTER — Encounter (HOSPITAL_COMMUNITY): Payer: Medicaid Other | Admitting: Anesthesiology

## 2012-11-25 ENCOUNTER — Encounter (HOSPITAL_COMMUNITY): Payer: Self-pay

## 2012-11-25 ENCOUNTER — Inpatient Hospital Stay (HOSPITAL_COMMUNITY): Payer: Medicaid Other | Admitting: Anesthesiology

## 2012-11-25 DIAGNOSIS — O365990 Maternal care for other known or suspected poor fetal growth, unspecified trimester, not applicable or unspecified: Secondary | ICD-10-CM

## 2012-11-25 LAB — RPR: RPR Ser Ql: NONREACTIVE

## 2012-11-25 MED ORDER — ONDANSETRON HCL 4 MG PO TABS: 4.0000 mg | ORAL_TABLET | ORAL | Status: DC | PRN

## 2012-11-25 MED ORDER — PRENATAL MULTIVITAMIN CH
1.0000 | ORAL_TABLET | Freq: Every day | ORAL | Status: DC
  Administered 2012-11-26 – 2012-11-27 (×2): 1 via ORAL
  Filled 2012-11-25 (×2): qty 1

## 2012-11-25 MED ORDER — DIBUCAINE 1 % RE OINT: 1.0000 "application " | TOPICAL_OINTMENT | RECTAL | Status: DC | PRN

## 2012-11-25 MED ORDER — MISOPROSTOL 25 MCG QUARTER TABLET
25.0000 ug | ORAL_TABLET | Freq: Once | ORAL | Status: DC
  Filled 2012-11-25: qty 0.25

## 2012-11-25 MED ORDER — FENTANYL CITRATE 0.05 MG/ML IJ SOLN
100.0000 ug | INTRAMUSCULAR | Status: DC | PRN
  Administered 2012-11-25 (×2): 100 ug via INTRAVENOUS
  Filled 2012-11-25 (×2): qty 2

## 2012-11-25 MED ORDER — SODIUM CHLORIDE 0.9 % IV SOLN: 250.0000 mL | INTRAVENOUS | Status: DC | PRN

## 2012-11-25 MED ORDER — SODIUM CHLORIDE 0.9 % IJ SOLN: 3.0000 mL | INTRAMUSCULAR | Status: DC | PRN

## 2012-11-25 MED ORDER — IBUPROFEN 600 MG PO TABS
600.0000 mg | ORAL_TABLET | Freq: Four times a day (QID) | ORAL | Status: DC
  Administered 2012-11-26 – 2012-11-27 (×7): 600 mg via ORAL
  Filled 2012-11-25 (×7): qty 1

## 2012-11-25 MED ORDER — OXYCODONE-ACETAMINOPHEN 5-325 MG PO TABS: 1.0000 | ORAL_TABLET | ORAL | Status: DC | PRN

## 2012-11-25 MED ORDER — FENTANYL 2.5 MCG/ML BUPIVACAINE 1/10 % EPIDURAL INFUSION (WH - ANES)
14.0000 mL/h | INTRAMUSCULAR | Status: DC | PRN
  Administered 2012-11-25: 14 mL/h via EPIDURAL
  Filled 2012-11-25: qty 125

## 2012-11-25 MED ORDER — SODIUM CHLORIDE 0.9 % IJ SOLN: 3.0000 mL | Freq: Two times a day (BID) | INTRAMUSCULAR | Status: DC

## 2012-11-25 MED ORDER — DIPHENHYDRAMINE HCL 25 MG PO CAPS: 25.0000 mg | ORAL_CAPSULE | Freq: Four times a day (QID) | ORAL | Status: DC | PRN

## 2012-11-25 MED ORDER — EPHEDRINE 5 MG/ML INJ
10.0000 mg | INTRAVENOUS | Status: DC | PRN
  Filled 2012-11-25: qty 2
  Filled 2012-11-25: qty 4

## 2012-11-25 MED ORDER — BENZOCAINE-MENTHOL 20-0.5 % EX AERO
1.0000 "application " | INHALATION_SPRAY | CUTANEOUS | Status: DC | PRN
  Administered 2012-11-26: 1 via TOPICAL
  Filled 2012-11-25: qty 56

## 2012-11-25 MED ORDER — TETANUS-DIPHTH-ACELL PERTUSSIS 5-2.5-18.5 LF-MCG/0.5 IM SUSP: 0.5000 mL | Freq: Once | INTRAMUSCULAR | Status: DC

## 2012-11-25 MED ORDER — EPHEDRINE 5 MG/ML INJ
10.0000 mg | INTRAVENOUS | Status: DC | PRN
  Filled 2012-11-25: qty 2

## 2012-11-25 MED ORDER — ONDANSETRON HCL 4 MG/2ML IJ SOLN: 4.0000 mg | INTRAMUSCULAR | Status: DC | PRN

## 2012-11-25 MED ORDER — LANOLIN HYDROUS EX OINT: TOPICAL_OINTMENT | CUTANEOUS | Status: DC | PRN

## 2012-11-25 MED ORDER — WITCH HAZEL-GLYCERIN EX PADS: 1.0000 "application " | MEDICATED_PAD | CUTANEOUS | Status: DC | PRN

## 2012-11-25 MED ORDER — ZOLPIDEM TARTRATE 5 MG PO TABS: 5.0000 mg | ORAL_TABLET | Freq: Every evening | ORAL | Status: DC | PRN

## 2012-11-25 MED ORDER — BISACODYL 10 MG RE SUPP: 10.0000 mg | Freq: Every day | RECTAL | Status: DC | PRN

## 2012-11-25 MED ORDER — DIPHENHYDRAMINE HCL 50 MG/ML IJ SOLN: 12.5000 mg | INTRAMUSCULAR | Status: DC | PRN

## 2012-11-25 MED ORDER — SIMETHICONE 80 MG PO CHEW: 80.0000 mg | CHEWABLE_TABLET | ORAL | Status: DC | PRN

## 2012-11-25 MED ORDER — MEASLES, MUMPS & RUBELLA VAC ~~LOC~~ INJ
0.5000 mL | INJECTION | Freq: Once | SUBCUTANEOUS | Status: DC
  Filled 2012-11-25: qty 0.5

## 2012-11-25 MED ORDER — LIDOCAINE HCL (PF) 1 % IJ SOLN
INTRAMUSCULAR | Status: DC | PRN
  Administered 2012-11-25 (×2): 5 mL

## 2012-11-25 MED ORDER — LACTATED RINGERS IV SOLN
500.0000 mL | Freq: Once | INTRAVENOUS | Status: AC
  Administered 2012-11-25: 17:00:00 via INTRAVENOUS

## 2012-11-25 MED ORDER — PHENYLEPHRINE 40 MCG/ML (10ML) SYRINGE FOR IV PUSH (FOR BLOOD PRESSURE SUPPORT)
80.0000 ug | PREFILLED_SYRINGE | INTRAVENOUS | Status: DC | PRN
  Filled 2012-11-25: qty 2

## 2012-11-25 MED ORDER — FLEET ENEMA 7-19 GM/118ML RE ENEM: 1.0000 | ENEMA | Freq: Every day | RECTAL | Status: DC | PRN

## 2012-11-25 MED ORDER — OXYTOCIN 40 UNITS IN LACTATED RINGERS INFUSION - SIMPLE MED: 62.5000 mL/h | INTRAVENOUS | Status: DC | PRN

## 2012-11-25 MED ORDER — PHENYLEPHRINE 40 MCG/ML (10ML) SYRINGE FOR IV PUSH (FOR BLOOD PRESSURE SUPPORT)
80.0000 ug | PREFILLED_SYRINGE | INTRAVENOUS | Status: DC | PRN
  Filled 2012-11-25: qty 2
  Filled 2012-11-25: qty 10

## 2012-11-25 MED ORDER — SENNOSIDES-DOCUSATE SODIUM 8.6-50 MG PO TABS
2.0000 | ORAL_TABLET | ORAL | Status: DC
  Administered 2012-11-26 (×2): 2 via ORAL
  Filled 2012-11-25 (×2): qty 2

## 2012-11-25 NOTE — Anesthesia Procedure Notes (Signed)
Epidural Patient location during procedure: OB Start time: 11/25/2012 5:20 PM  Staffing Anesthesiologist: Brayton Caves Performed by: anesthesiologist   Preanesthetic Checklist Completed: patient identified, site marked, surgical consent, pre-op evaluation, timeout performed, IV checked, risks and benefits discussed and monitors and equipment checked  Epidural Patient position: sitting Prep: site prepped and draped and DuraPrep Patient monitoring: continuous pulse ox and blood pressure Approach: midline Injection technique: LOR air  Needle:  Needle type: Tuohy  Needle gauge: 17 G Needle length: 9 cm and 9 Needle insertion depth: 5 cm cm Catheter type: closed end flexible Catheter size: 19 Gauge Catheter at skin depth: 10 cm Test dose: negative  Assessment Events: blood not aspirated, injection not painful, no injection resistance, negative IV test and no paresthesia  Additional Notes Patient identified.  Risk benefits discussed including failed block, incomplete pain control, headache, nerve damage, paralysis, blood pressure changes, nausea, vomiting, reactions to medication both toxic or allergic, and postpartum back pain.  Patient expressed understanding and wished to proceed.  All questions were answered.  Sterile technique used throughout procedure and epidural site dressed with sterile barrier dressing. No paresthesia or other complications noted.The patient did not experience any signs of intravascular injection such as tinnitus or metallic taste in mouth nor signs of intrathecal spread such as rapid motor block. Please see nursing notes for vital signs.

## 2012-11-25 NOTE — Progress Notes (Signed)
Updated CNM that foley still in place after SROM.

## 2012-11-25 NOTE — H&P (Signed)
Shelley Bishop is a 22 y.o. female G1P0 at 37.0wks by LMP and confirmed by 17wk scan presenting for IOL due to IUGR <10th%. The growth was initially followed at 33wks where the EFW was at the 10th%, then on 11/16/12 it was <10th% with nl AFI and dopplers. Her preg has been followed by the [facility] and then was recently tx to [facility] due to IUGR. Prior to this her preg was essentially unremarkable. History OB History   Grav Para Term Preterm Abortions TAB SAB Ect Mult Living   1              Past Medical History  Diagnosis Date  . IUGR (intrauterine growth restriction)   . Medical history non-contributory    Past Surgical History  Procedure Laterality Date  . No past surgeries     Family History: family history includes Hypertension in her mother. Social History:  reports that she has never smoked. She has never used smokeless tobacco. She reports that she does not drink alcohol or use illicit drugs.   Prenatal Transfer Tool  Maternal Diabetes: No Genetic Screening: Normal Maternal Ultrasounds/Referrals: Abnormal:  Findings:   IUGR Fetal Ultrasounds or other Referrals:  Referred to Materal Fetal Medicine  Maternal Substance Abuse:  No Significant Maternal Medications:  None Significant Maternal Lab Results:  Lab values include: Group B Strep negative Other Comments:  None  ROS  Dilation: 1 Effacement (%): 70 Station: -2 Exam by:: LCarpenter,RN Blood pressure 125/72, pulse 98, temperature 99.2 F (37.3 C), temperature source Oral, resp. rate 18, height 5\' 3"  (1.6 m), weight 68.493 kg (151 lb), last menstrual period 03/10/2012. Exam Physical Exam  Constitutional: She is oriented to person, place, and time. She appears well-developed.  HENT:  Head: Normocephalic.  Neck: Normal range of motion.  Cardiovascular: Normal rate.   Respiratory: Effort normal.  GI:  EFM 140s +accels, no decels Occ ctx per toco  Musculoskeletal: Normal range of motion.  Neurological: She is alert  and oriented to person, place, and time.  Skin: Skin is warm and dry.  Psychiatric: She has a normal mood and affect. Her behavior is normal. Thought content normal.    Prenatal labs: ABO, Rh: B/Positive/-- (05/19 0000) Antibody: Negative (05/19 0000) Rubella: Immune (05/19 0000) RPR: Nonreactive (09/18 0000)  HBsAg: Negative (05/19 0000)  HIV: Non-reactive (09/18 0000)  GBS: Negative (11/10 0000)   Assessment/Plan: IUP at 37.0wks IUGR with nl fluid/dopplers Unfavorable cx  Admit to L&D Plan to ripen cx with cytotec and then foley bulb as able   Amato Sevillano 11/25/2012, 12:02 AM

## 2012-11-25 NOTE — Anesthesia Preprocedure Evaluation (Signed)

## 2012-11-25 NOTE — Progress Notes (Signed)
Catheter tugged, plus SVE, foley did not fall out or was able to remove.

## 2012-11-26 NOTE — Lactation Note (Signed)
This note was copied from the chart of Shelley Bishop. Lactation Consultation Note  Patient Name: Shelley Alaycia Eardley [id number] Date: 11/26/2012 Reason for consult: Initial assessment;Difficult latch;Infant < 6lbs Assisted Mom with latching to left breast. The left breast is soft compressible, the right nipple/aerola is thick, tough, non-compressible. Baby very sleepy at the breast, took few suckles but would not stay awake at the breast. Tried #20 nipple shield and his suckling improved, however he could not obtain good depth with the nipple shield and it was painful for Mom. Mom supplemented using bottle with slow flow nipple. Demonstrated suck training to Mom. Advised Mom Peds wants baby supplemented every 3 hours. Continue to put baby to the breast to help him learn to latch, but limit his time at the breast for now to 10-15 minutes if he will latch. Supplement 7-10 ml of EBM or formula for tonight. Follow supplementing guidelines. Mom to post pump on preemie setting for 15 minutes every 3 hours. Ask for assist as needed.   Maternal Data Formula Feeding for Exclusion: Yes Reason for exclusion: Mother's choice to formula and breast feed on admission Infant to breast within first hour of birth: Yes Has patient been taught Hand Expression?: Yes Does the patient have breastfeeding experience prior to this delivery?: No  Feeding Feeding Type: Breast Fed Length of feed: 3 min  LATCH Score/Interventions Latch: Repeated attempts needed to sustain latch, nipple held in mouth throughout feeding, stimulation needed to elicit sucking reflex. Intervention(s): Adjust position;Assist with latch;Breast massage;Breast compression  Audible Swallowing: None  Type of Nipple: Everted at rest and after stimulation (right nipple not compressible/edema present) Intervention(s): Double electric pump  Comfort (Breast/Nipple): Soft / non-tender     Hold (Positioning): Assistance needed to  correctly position infant at breast and maintain latch. Intervention(s): Breastfeeding basics reviewed;Support Pillows;Position options;Skin to skin  LATCH Score: 6  Lactation Tools Discussed/Used Tools: Nipple Shields;Pump;13F feeding tube / Syringe Nipple shield size: 20 Breast pump type: Double-Electric Breast Pump WIC Program: Yes Pump Review: Setup, frequency, and cleaning;Milk Storage   Consult Status Consult Status: Follow-up Date: 11/27/12 Follow-up type: In-patient    Alfred Levins 11/26/2012, 7:05 PM

## 2012-11-26 NOTE — Anesthesia Postprocedure Evaluation (Signed)
  Anesthesia Post Note  Patient: Shelley Bishop  Procedure(s) Performed: * No procedures listed *  Anesthesia type: Epidural  Patient location: labor and delivery  Post pain: Pain level controlled  Post assessment: Post-op Vital signs reviewed  Last Vitals:  Filed Vitals:   11/26/12 1015  BP: 108/62  Pulse: 102  Temp: 35.6 C  Resp: 18    Post vital signs: Reviewed  Level of consciousness: awake  Complications: No apparent anesthesia complications

## 2012-11-26 NOTE — Progress Notes (Signed)
Post Partum Day 1 Subjective: Eating, drinking, voiding, ambulating well.  +flatus.  Lochia and pain wnl.  Denies dizziness, lightheadedness, or sob. No complaints.   Objective: Blood pressure 119/72, pulse 92, temperature 98.7 F (37.1 C), temperature source Oral, resp. rate 18, height 5\' 3"  (1.6 m), weight 68.493 kg (151 lb), last menstrual period 03/10/2012, SpO2 100.00%, unknown if currently breastfeeding.  Physical Exam:  General: alert, cooperative and no distress Lochia: appropriate Uterine Fundus: firm Incision: n/a DVT Evaluation: No evidence of DVT seen on physical exam. Negative Homan's sign. No cords or calf tenderness. No significant calf/ankle edema.   Recent Labs  11/24/12 2010  HGB 11.3*  HCT 33.6*    Assessment/Plan: Plan for discharge tomorrow, Breastfeeding and Lactation consult Discussed contraception options, still undecided, would like to talk it over w/ fob   LOS: 2 days   Marge Duncans 11/26/2012, 8:56 AM

## 2012-11-27 MED ORDER — IBUPROFEN 600 MG PO TABS: 600.0000 mg | ORAL_TABLET | Freq: Four times a day (QID) | ORAL | Status: DC

## 2012-11-27 NOTE — Discharge Summary (Signed)
Obstetric Discharge Summary Reason for Admission: induction of labor for IUGR <10th%ile Prenatal Procedures: ultrasound Intrapartum Procedures: spontaneous vaginal delivery Postpartum Procedures: none Complications-Operative and Postpartum: 2nd degree perineal laceration Hemoglobin  Date Value Range Status  11/24/2012 11.3* 12.0 - 15.0 g/dL Final  1/61/0960 9.9   Final     HCT  Date Value Range Status  11/24/2012 33.6* 36.0 - 46.0 % Final  09/28/2012 30   Final   Hospital Course: Admitted for IOL for IUGR <10%ile. IOL successful, SROM with FB in place, with subsequent uncomplicated NSVD, requiring repair for 2nd degree laceration. Otherwise uncomplicated. Baby breast-feeding, with some initially poor latch scores. Mother undecided on contraception.  Physical Exam:  General: alert, cooperative, appears stated age and no distress Lochia: appropriate Uterine Fundus: firm Incision: n/a DVT Evaluation: No evidence of DVT seen on physical exam. Negative Homan's sign.  Discharge Diagnoses: Term Pregnancy-delivered and IUGR  Discharge Information: Date: 11/27/2012 Activity: pelvic rest Diet: routine Medications: PNV and Ibuprofen Condition: stable Instructions: refer to practice specific booklet Discharge to: home Follow-up Information   Follow up with [facility] In 6 weeks. (For post-partum visit)    Specialty:  Obstetrics and Gynecology   Contact information:   [address] [place] [address] [phone]      Newborn Data: Live born female  Birth Weight: 4 lb 13.8 oz (2206 g) APGAR: 9, 9  Home with mother.  Bobbye Morton, MD PGY-2, [facility] 11/27/2012, 8:00 AM  I spoke with and examined patient and agree with resident's note and plan of care.  Tawana Scale, MD OB Fellow 11/27/2012 8:08 AM

## 2012-11-27 NOTE — Anesthesia Postprocedure Evaluation (Signed)
  Anesthesia Post-op Note  Patient: Shelley Bishop  Procedure(s) Performed: * No procedures listed *  Patient Location: PACU and Mother/Baby  Anesthesia Type:Epidural  Level of Consciousness: awake, alert  and oriented  Airway and Oxygen Therapy: Patient Spontanous Breathing  Post-op Pain: none  Post-op Assessment: Post-op Vital signs reviewed, Patient's Cardiovascular Status Stable, No headache, No backache, No residual numbness and No residual motor weakness  Post-op Vital Signs: Reviewed and stable  Complications: No apparent anesthesia complications

## 2012-11-27 NOTE — Progress Notes (Signed)
UR chart review completed.  

## 2012-12-01 ENCOUNTER — Ambulatory Visit (HOSPITAL_COMMUNITY)
Admission: RE | Admit: 2012-12-01 | Discharge: 2012-12-01 | Disposition: A | Discharge status: Home / Self Care | Payer: Medicaid Other | Source: Ambulatory Visit | Attending: Obstetrics & Gynecology | Admitting: Obstetrics & Gynecology

## 2012-12-01 NOTE — Lactation Note (Signed)
Infant Lactation Consultation Outpatient Visit Note  Patient Name: Shelley Bishop                                 Baby name:  Shelley Bishop Date of Birth: 03/08/1990                                                           DOB:  [birth year] Birth Weight:                                                                             BW:  4lbs 13.8oz    Discharge weight: 4 lbs 10 oz. Gestational Age at Delivery: 37w 1d                                         Today's weight: 4 lbs. 15.8 oz Type of Delivery: Vaginal  Breastfeeding History Frequency of Breastfeeding: every 2-3 hrs Length of Feeding: 20-30 min Voids: 10/24 hrs Stools: 7/24 hrs (yellow)  Supplementing / Method: Pumping:  Type of Pump:  manual   Frequency: none since discharge  Volume:  0  Supplementing baby with 30-60 ml of formula after breast feeding by bottle  Comments: Reviewed use of positioning baby in cross cradle hold.  Mom using modified cradle, so assisted her in cross cradle with explanation.  Baby initially too shallow on the breast, but with use of breast compression, he was able to get deeper.  Baby fed skin to skin, tummy to tummy, for 15 mins on left breast.  Swallowing heard.  Both breasts soft to touch.   Mom denies feeling engorgement period.  Mom states that baby had just had a feeding less than an hour before appointment, as he was fussy.   Consultation Evaluation:  Initial Feeding Assessment: Pre-feed Weight:  2262 gm Post-feed Weight:  2276 gm Amount Transferred: 14 ml Comments: 15 mins on right breast using cross cradle hold, and alternating breast compression.  Tried 2nd breast, but baby too sleepy  Total Breast milk Transferred this Visit: 14 ml Total Supplement Given: 0  Discussed with Mom and FOB about the importance of double pumping to support Mom's milk supply.  They stated they had not obtained a WIC pump as they were told there wasn't enough available.  Loaner Medela Symphony pump given  for 1 week.  Plan of care written to include offering 30-60 ml of expressed breast milk, and/or formula by slow flow bottle after breast feeding, and double pumping 15-20 mins following breast feeding.  Encouraged skin to skin at breast to continue.   Follow-Up November 28th @ 1pm     Shelley Bishop 12/01/2012, 10:06 AM

## 2012-12-08 ENCOUNTER — Ambulatory Visit (HOSPITAL_COMMUNITY)
Admission: RE | Admit: 2012-12-08 | Discharge: 2012-12-08 | Disposition: A | Discharge status: Home / Self Care | Payer: Medicaid Other | Source: Ambulatory Visit | Attending: Obstetrics & Gynecology | Admitting: Obstetrics & Gynecology

## 2012-12-08 NOTE — Lactation Note (Addendum)
Adult Lactation Consultation Outpatient Visit Note  Patient Name: Shelley Bishop  Baby: Shelley Bishop Date of Birth: 11/01/1990    DOB: [birth year] Gestational Age at Delivery: [redacted]w[redacted]d   BW: 4# 13.8oz (2206g) Type of Delivery:      Today's weight: 5# 6.5oz (2454g)  Breastfeeding History: Frequency of Breastfeeding: q2-3h, Length of Feeding: 10-70min   Voids: light yellow Stools: yellow, seedy, somewhat firm   Supplementing / Method: Pumping:  Type of Pump: Symphony   Frequency: bid (15 min)  Volume: 2 oz total  Comments: 40 mL of formula (Similac), sometimes up to 4-5 times/day.  Sometimes bottles have EBM.  Consultation Evaluation:  Initial Feeding Assessment: Pre-feed Weight: 2454g Post-feed Weight: 2478g Amount Transferred: 24mL Comments: L breast, nipple shield (size 20) applied after baby not latching well  Additional Feeding Assessment: Pre-feed Weight: 2478g Post-feed Weight: 2482g Amount Transferred: 4mL Comments:R breast, nipple shield (size 20)  Additional Feeding Assessment: Pre-feed Weight: 2482g Post-feed Weight: 2506g Amount Transferred:67mL Comments:R breast again, nipple shield (size 20)  Additional Feeding Assessment: Pre-feed Weight: 2506g Post-feed [id number] Amount Transferred:3mL Comments:R breast again, nipple shield (size 20)  Total Breast milk Transferred this Visit: 64mL  Follow-up: Mom has not been using a nipple shield since going home.  Mom will try to latch the baby and if he doesn't latch within 10-15 min, she gives him a bottle of formula or EBM.  When I observed baby at the breast, baby was unable to get a deep latch or maintain even a shallow latch.  However, Mom's milk supply is so abundant, baby is able to "drink" some from the breast without a true latch.    A nipple shield (size 20) was applied and baby's ability to latch improved significantly.    B/c of baby's small size, he needs some additional support (at shoulder  girdle, etc) so that he transfers well. Mom encouraged to use the nipple shield until baby gains more weight.  Cleaning instructions of nipple shield given.  Mom pleased w/how well baby did after nipple shield use.    F/u w/lactation is in 1 week, on Dec 5th.  Mom has a Nature conservation officer.  She has her appt w/WIC next week.  Lurline [facility] 12/08/2012, 12:56 PM

## 2012-12-27 ENCOUNTER — Ambulatory Visit: Payer: Medicaid Other | Admitting: Advanced Practice Midwife

## 2013-01-29 ENCOUNTER — Ambulatory Visit: Payer: Medicaid Other | Admitting: Family Medicine

## 2013-02-01 ENCOUNTER — Ambulatory Visit: Payer: Medicaid Other | Admitting: Advanced Practice Midwife

## 2013-02-07 ENCOUNTER — Ambulatory Visit (INDEPENDENT_AMBULATORY_CARE_PROVIDER_SITE_OTHER): Payer: Medicaid Other | Admitting: Obstetrics & Gynecology

## 2013-02-07 ENCOUNTER — Encounter: Payer: Self-pay | Admitting: Obstetrics & Gynecology

## 2013-02-07 NOTE — Progress Notes (Signed)
Pt wants to discuss birth control methods

## 2013-02-07 NOTE — Patient Instructions (Signed)
Etonogestrel implant What is this medicine? ETONOGESTREL (et oh noe JES trel) is a contraceptive (birth control) device. It is used to prevent pregnancy. It can be used for up to 3 years. This medicine may be used for other purposes; ask your health care provider or pharmacist if you have questions. COMMON BRAND NAME(S): Implanon, Nexplanon  What should I tell my health care provider before I take this medicine? They need to know if you have any of these conditions: -abnormal vaginal bleeding -blood vessel disease or blood clots -cancer of the breast, cervix, or liver -depression -diabetes -gallbladder disease -headaches -heart disease or recent heart attack -high blood pressure -high cholesterol -kidney disease -liver disease -renal disease -seizures -tobacco smoker -an unusual or allergic reaction to etonogestrel, other hormones, anesthetics or antiseptics, medicines, foods, dyes, or preservatives -pregnant or trying to get pregnant -breast-feeding How should I use this medicine? This device is inserted just under the skin on the inner side of your upper arm by a health care professional. Talk to your pediatrician regarding the use of this medicine in children. Special care may be needed. Overdosage: If you think you've taken too much of this medicine contact a poison control center or emergency room at once. Overdosage: If you think you have taken too much of this medicine contact a poison control center or emergency room at once. NOTE: This medicine is only for you. Do not share this medicine with others. What if I miss a dose? This does not apply. What may interact with this medicine? Do not take this medicine with any of the following medications: -amprenavir -bosentan -fosamprenavir This medicine may also interact with the following medications: -barbiturate medicines for inducing sleep or treating seizures -certain medicines for fungal infections like ketoconazole and  itraconazole -griseofulvin -medicines to treat seizures like carbamazepine, felbamate, oxcarbazepine, phenytoin, topiramate -modafinil -phenylbutazone -rifampin -some medicines to treat HIV infection like atazanavir, indinavir, lopinavir, nelfinavir, tipranavir, ritonavir -St. John's wort This list may not describe all possible interactions. Give your health care provider a list of all the medicines, herbs, non-prescription drugs, or dietary supplements you use. Also tell them if you smoke, drink alcohol, or use illegal drugs. Some items may interact with your medicine. What should I watch for while using this medicine? This product does not protect you against HIV infection (AIDS) or other sexually transmitted diseases. You should be able to feel the implant by pressing your fingertips over the skin where it was inserted. Tell your doctor if you cannot feel the implant. What side effects may I notice from receiving this medicine? Side effects that you should report to your doctor or health care professional as soon as possible: -allergic reactions like skin rash, itching or hives, swelling of the face, lips, or tongue -breast lumps -changes in vision -confusion, trouble speaking or understanding -dark urine -depressed mood -general ill feeling or flu-like symptoms -light-colored stools -loss of appetite, nausea -right upper belly pain -severe headaches -severe pain, swelling, or tenderness in the abdomen -shortness of breath, chest pain, swelling in a leg -signs of pregnancy -sudden numbness or weakness of the face, arm or leg -trouble walking, dizziness, loss of balance or coordination -unusual vaginal bleeding, discharge -unusually weak or tired -yellowing of the eyes or skin Side effects that usually do not require medical attention (Report these to your doctor or health care professional if they continue or are bothersome.): -acne -breast pain -changes in  weight -cough -fever or chills -headache -irregular menstrual bleeding -itching, burning,   and vaginal discharge -pain or difficulty passing urine -sore throat This list may not describe all possible side effects. Call your doctor for medical advice about side effects. You may report side effects to FDA at 1-800-FDA-1088. Where should I keep my medicine? This drug is given in a hospital or clinic and will not be stored at home. NOTE: This sheet is a summary. It may not cover all possible information. If you have questions about this medicine, talk to your doctor, pharmacist, or health care provider.  2014, [employer]vier/Gold Standard. (2011-07-05 15:37:45)  

## 2013-02-07 NOTE — Progress Notes (Signed)
   Subjective:    Patient ID: Shelley Bishop, female    DOB: 03-Feb-1990, 22 y.o.   MRN: [id number]  HPI  23 yo AA P1 now 8 weeks pp s/p NSVD, IOL for IUGR. She is doing well. She denies pp depression and scored 0. She has had sex without dyspareunia and uses condoms. She is interested in Nexplanon as she wants her next pregnancy in about 2-3 years. She is breast feeding and reports that the baby is growing well.   Review of Systems     Objective:   Physical Exam  Normal vulva and bimanual exam      Assessment & Plan:  Pp- doing well She will get her Nexplanon at the health dept.

## 2013-11-12 ENCOUNTER — Encounter: Payer: Self-pay | Admitting: Obstetrics & Gynecology

## 2014-05-27 ENCOUNTER — Ambulatory Visit (INDEPENDENT_AMBULATORY_CARE_PROVIDER_SITE_OTHER): Payer: Self-pay | Admitting: Family Medicine

## 2014-05-27 VITALS — BP 100/62 | HR 88 | Temp 98.0°F | Resp 18 | Ht 63.0 in | Wt 143.0 lb

## 2014-05-27 DIAGNOSIS — N926 Irregular menstruation, unspecified: Secondary | ICD-10-CM

## 2014-05-27 LAB — CBC
HCT: 37.4 % (ref 36.0–46.0)
Hemoglobin: 12.5 g/dL (ref 12.0–15.0)
MCH: 26.7 pg (ref 26.0–34.0)
MCHC: 33.4 g/dL (ref 30.0–36.0)
MCV: 79.9 fL (ref 78.0–100.0)
MPV: 8.9 fL (ref 8.6–12.4)
PLATELETS: 360 10*3/uL (ref 150–400)
RBC: 4.68 MIL/uL (ref 3.87–5.11)
RDW: 13.7 % (ref 11.5–15.5)
WBC: 7.1 10*3/uL (ref 4.0–10.5)

## 2014-05-27 LAB — POCT URINE PREGNANCY: Preg Test, Ur: NEGATIVE

## 2014-05-27 NOTE — Progress Notes (Signed)
[facility] [address], [place] [address] [phone]  Date:  05/27/2014   Name:  Shelley Bishop   DOB:  01/16/1990   MRN:  [id number]  PCP:  Emily Filbert., MD    Chief Complaint: Possible Pregnancy   History of Present Illness:  Shelley Bishop is a 24 y.o. very pleasant female patient who presents with the following:  Patient is here today for pregnancy test.  She states that she had a negative pregnancy test at home, but wanted to be sure.  Last menstrual period 05/16/2014, that appeared shorter than normal.  She has no abnormal heavy bleeding, or dizziness.  Abdomen is slightly enlarged for 2 weeks, it is more with eating.  No abnormal vaginal discharge, odor, or bleeding .  No constipation or diarrhea.  No dysuria, frequency.  She feels a loss of appetite.  No fever.    Diet: Lucendia Herrlich, fried food, cabbage, carrots, spinach, green beans.  5 regular sized bottles.    Bowels: Normal, no constipation or diarrhea, no blood in the stool  Normal urination, hematuria, no pain  Working: Quarry manager, Management consultant.  Sexually active, married.     Patient Active Problem List   Diagnosis Date Noted  . Supervision of high-risk pregnancy 11/20/2012  . Symmetric IUGR complicating pregnancy, antepartum 11/20/2012    Past Medical History  Diagnosis Date  . IUGR (intrauterine growth restriction)   . Medical history non-contributory     Past Surgical History  Procedure Laterality Date  . No past surgeries      History  Substance Use Topics  . Smoking status: Never Smoker   . Smokeless tobacco: Never Used  . Alcohol Use: No    Family History  Problem Relation Age of Onset  . Hypertension Mother     No Known Allergies  Medication list has been reviewed and updated.  Current Outpatient Prescriptions on File Prior to Visit  Medication Sig Dispense Refill  . ibuprofen (ADVIL,MOTRIN) 600 MG tablet Take 1 tablet (600 mg total) by mouth  every 6 (six) hours. (Patient not taking: Reported on 05/27/2014) 30 tablet 0  . Prenatal Vitamins (DIS) TABS Take 1 tablet by mouth every morning. (Patient not taking: Reported on 05/27/2014) 90 tablet 3   No current facility-administered medications on file prior to visit.    Review of Systems  Constitutional: Negative for fever, chills, weight loss and diaphoresis.  HENT: Negative for congestion, ear pain, hearing loss and sore throat.   Eyes: Negative for pain.  Respiratory: Negative for cough, shortness of breath and wheezing.   Cardiovascular: Negative for chest pain, palpitations and leg swelling.  Gastrointestinal: Negative for nausea, vomiting, diarrhea, constipation and blood in stool.  Genitourinary: Negative for dysuria, frequency and hematuria.  Musculoskeletal: Negative for neck pain.  Skin: Negative for rash.  Neurological: Positive for headaches (secondary to fatigue.). Negative for dizziness and tingling.  Psychiatric/Behavioral: Negative for depression. The patient is not nervous/anxious.      Physical Examination: Filed Vitals:   05/27/14 1456  BP: 100/62  Pulse: 88  Temp: 98 F (36.7 C)  Resp: 18   Filed Vitals:   05/27/14 1456  Height: 5\' 3"  (1.6 m)  Weight: 143 lb (64.864 kg)   Body mass index is 25.34 kg/(m^2). Ideal Body Weight: Weight in (lb) to have BMI = 25: 140.8  Physical Exam  Constitutional: She is oriented to person, place, and time. She appears well-developed and well-nourished. No distress.  HENT:  Head: Normocephalic and atraumatic.  Right Ear: External ear normal.  Left Ear: External ear normal.  Nose: Nose normal.  Mouth/Throat: Oropharynx is clear and moist. No oropharyngeal exudate.  Eyes: Conjunctivae and EOM are normal. Pupils are equal, round, and reactive to light. Right eye exhibits no discharge. Left eye exhibits no discharge. No scleral icterus.  Neck: Normal range of motion. Neck supple. Thyromegaly (full without nodules or  masses) present.  Cardiovascular: Normal rate, regular rhythm, normal heart sounds and intact distal pulses.  Exam reveals no friction rub.   No murmur heard. Pulmonary/Chest: Effort normal and breath sounds normal. No respiratory distress. She has no wheezes.  Abdominal: Soft. Bowel sounds are normal. She exhibits no distension and no mass. There is no tenderness.  Genitourinary: Vagina normal and uterus normal. Pelvic exam was performed with patient supine. There is no rash on the right labia. There is no rash on the left labia. Cervix exhibits no motion tenderness, no discharge and no friability. Right adnexum displays no mass, no tenderness and no fullness. Left adnexum displays no mass, no tenderness and no fullness. No erythema in the vagina. No vaginal discharge found.  Musculoskeletal: Normal range of motion.  Neurological: She is alert and oriented to person, place, and time.  Skin: Skin is warm and dry. She is not diaphoretic.  Psychiatric: She has a normal mood and affect. Her behavior is normal.   Pregnancy test negative.  Assessment and Plan: 24 year old female is here today for pregnancy test and a check up.   Orders placed, will follow up with results.   Abnormal menses - Plan: POCT urine pregnancy, CBC, TSH, Pap IG and Chlamydia/Gonococcus, NAA  Ivar Drape, PA-C [facility] 5/17/20168:12 PM

## 2014-05-27 NOTE — Patient Instructions (Signed)
I will have your lab results within the next 10 days.

## 2014-05-28 LAB — TSH: TSH: 0.629 u[IU]/mL (ref 0.350–4.500)

## 2014-05-29 LAB — PAP IG AND CT-NG NAA
Chlamydia Probe Amp: NEGATIVE
GC Probe Amp: NEGATIVE

## 2014-10-25 ENCOUNTER — Ambulatory Visit (INDEPENDENT_AMBULATORY_CARE_PROVIDER_SITE_OTHER): Payer: Self-pay | Admitting: Emergency Medicine

## 2014-10-25 VITALS — BP 110/80 | HR 80 | Temp 98.5°F | Resp 18 | Ht 63.0 in | Wt 146.2 lb

## 2014-10-25 DIAGNOSIS — Z3490 Encounter for supervision of normal pregnancy, unspecified, unspecified trimester: Secondary | ICD-10-CM

## 2014-10-25 DIAGNOSIS — N926 Irregular menstruation, unspecified: Secondary | ICD-10-CM

## 2014-10-25 LAB — POCT URINE PREGNANCY: PREG TEST UR: POSITIVE — AB

## 2014-10-25 MED ORDER — PRENATAL 27-1 MG PO TABS: 1.0000 | ORAL_TABLET | Freq: Every day | ORAL | Status: DC

## 2014-10-25 NOTE — Progress Notes (Signed)
Subjective:  Patient ID: Shelley Bishop, female    DOB: [birth year]  Age: 24 y.o. MRN: [id number]  CC: Possible Pregnancy and Immunizations   HPI Shelley Bishop presents  for pregnancy test. She had her last menstrual period on August 24. She's had 2 negative home pregnancy test and one positive when she wants to get a pregnancy test today to qualify for Medicaid. She's had one uncomplicated pregnancy in the past. She has no acute complaint  History Shelley Bishop has a past medical history of IUGR (intrauterine growth restriction) and Medical history non-contributory.   She has past surgical history that includes No past surgeries.   Her  family history includes Hypertension in her mother.  She   reports that she has never smoked. She has never used smokeless tobacco. She reports that she does not drink alcohol or use illicit drugs.  Outpatient Prescriptions Prior to Visit  Medication Sig Dispense Refill  . ibuprofen (ADVIL,MOTRIN) 600 MG tablet Take 1 tablet (600 mg total) by mouth every 6 (six) hours. (Patient not taking: Reported on 05/27/2014) 30 tablet 0  . Prenatal Vitamins (DIS) TABS Take 1 tablet by mouth every morning. (Patient not taking: Reported on 05/27/2014) 90 tablet 3   No facility-administered medications prior to visit.    Social History   Social History  . Marital Status: Married    Spouse Name: N/A  . Number of Children: N/A  . Years of Education: N/A   Social History Main Topics  . Smoking status: Never Smoker   . Smokeless tobacco: Never Used  . Alcohol Use: No  . Drug Use: No  . Sexual Activity: Yes    Birth Control/ Protection: Condom     Comment: would like to discuss other methods   Other Topics Concern  . None   Social History Narrative     Review of Systems  Constitutional: Negative for fever, chills and appetite change.  HENT: Negative for congestion, ear pain, postnasal drip, sinus pressure and sore throat.   Eyes: Negative  for pain and redness.  Respiratory: Negative for cough, shortness of breath and wheezing.   Cardiovascular: Negative for leg swelling.  Gastrointestinal: Negative for nausea, vomiting, abdominal pain, diarrhea, constipation and blood in stool.  Endocrine: Negative for polyuria.  Genitourinary: Positive for menstrual problem. Negative for dysuria, urgency, frequency and flank pain.  Musculoskeletal: Negative for gait problem.  Skin: Negative for rash.  Neurological: Negative for weakness and headaches.  Psychiatric/Behavioral: Negative for confusion and decreased concentration. The patient is not nervous/anxious.     Objective:  BP 110/80 mmHg  Pulse 80  Temp(Src) 98.5 F (36.9 C) (Oral)  Resp 18  Ht 5\' 3"  (1.6 m)  Wt 146 lb 3.2 oz (66.316 kg)  BMI 25.90 kg/m2  SpO2 97%  LMP 08/22/2014  Physical Exam  Constitutional: She is oriented to person, place, and time. She appears well-developed and well-nourished.  HENT:  Head: Normocephalic and atraumatic.  Eyes: Conjunctivae are normal. Pupils are equal, round, and reactive to light.  Pulmonary/Chest: Effort normal.  Musculoskeletal: She exhibits no edema.  Neurological: She is alert and oriented to person, place, and time.  Skin: Skin is dry.  Psychiatric: She has a normal mood and affect. Her behavior is normal. Thought content normal.      Assessment & Plan:   Cybele was seen today for possible pregnancy and immunizations.  Diagnoses and all orders for this visit:  Missed period -     POCT urine  pregnancy  Pregnancy  Other orders -     PRENATAL 27-1 MG TABS; Take 1 tablet by mouth daily.   I am having Ms. Moussa Bishop start on PRENATAL. I am also having her maintain her Prenatal Vitamins and ibuprofen.  Meds ordered this encounter  Medications  . PRENATAL 27-1 MG TABS    Sig: Take 1 tablet by mouth daily.    Dispense:  30 each    Refill:  12    Appropriate red flag conditions were discussed with the patient  as well as actions that should be taken.  Patient expressed his understanding.  Follow-up: No Follow-up on file.  Roselee Culver, MD   Results for orders placed or performed in visit on 10/25/14  POCT urine pregnancy  Result Value Ref Range   Preg Test, Ur Positive (A) Negative

## 2014-10-31 ENCOUNTER — Emergency Department (HOSPITAL_COMMUNITY): Payer: Medicaid Other

## 2014-10-31 ENCOUNTER — Emergency Department (HOSPITAL_COMMUNITY)
Admission: EM | Admit: 2014-10-31 | Discharge: 2014-10-31 | Disposition: A | Discharge status: Home / Self Care | Payer: Medicaid Other | Attending: Emergency Medicine | Admitting: Emergency Medicine

## 2014-10-31 ENCOUNTER — Encounter (HOSPITAL_COMMUNITY): Payer: Self-pay | Admitting: Neurology

## 2014-10-31 DIAGNOSIS — O209 Hemorrhage in early pregnancy, unspecified: Secondary | ICD-10-CM | POA: Diagnosis present

## 2014-10-31 DIAGNOSIS — O4690 Antepartum hemorrhage, unspecified, unspecified trimester: Secondary | ICD-10-CM

## 2014-10-31 DIAGNOSIS — O200 Threatened abortion: Secondary | ICD-10-CM

## 2014-10-31 DIAGNOSIS — Z3A01 Less than 8 weeks gestation of pregnancy: Secondary | ICD-10-CM | POA: Insufficient documentation

## 2014-10-31 LAB — CBC
HCT: 37 % (ref 36.0–46.0)
Hemoglobin: 12.2 g/dL (ref 12.0–15.0)
MCH: 26.8 pg (ref 26.0–34.0)
MCHC: 33 g/dL (ref 30.0–36.0)
MCV: 81.1 fL (ref 78.0–100.0)
PLATELETS: 287 10*3/uL (ref 150–400)
RBC: 4.56 MIL/uL (ref 3.87–5.11)
RDW: 13.7 % (ref 11.5–15.5)
WBC: 8.9 10*3/uL (ref 4.0–10.5)

## 2014-10-31 LAB — WET PREP, GENITAL
Trich, Wet Prep: NONE SEEN
Yeast Wet Prep HPF POC: NONE SEEN

## 2014-10-31 LAB — URINALYSIS, ROUTINE W REFLEX MICROSCOPIC
Bilirubin Urine: NEGATIVE
Glucose, UA: NEGATIVE mg/dL
Ketones, ur: NEGATIVE mg/dL
LEUKOCYTES UA: NEGATIVE
Nitrite: NEGATIVE
PROTEIN: NEGATIVE mg/dL
SPECIFIC GRAVITY, URINE: 1.012 (ref 1.005–1.030)
UROBILINOGEN UA: 0.2 mg/dL (ref 0.0–1.0)
pH: 6.5 (ref 5.0–8.0)

## 2014-10-31 LAB — I-STAT BETA HCG BLOOD, ED (MC, WL, AP ONLY): I-stat hCG, quantitative: >2000 m[IU]/mL — ABNORMAL HIGH (ref <5)

## 2014-10-31 LAB — URINE MICROSCOPIC-ADD ON

## 2014-10-31 NOTE — ED Provider Notes (Signed)
CSN: [id number]     Arrival date & time 10/31/14  1723 History   First MD Initiated Contact with Patient 10/31/14 2029     Chief Complaint  Patient presents with  . Vaginal Bleeding     (Consider location/radiation/quality/duration/timing/severity/associated sxs/prior Treatment) Patient is a 24 y.o. female presenting with vaginal bleeding. The history is provided by the patient. No language interpreter was used.  Vaginal Bleeding Associated symptoms: no abdominal pain, no dysuria and no fever   Ms. Shelley Bishop is a 24 y.o female with a history of intrauterine growth restriction (G2P1A0) who is [redacted] weeks pregnant and had a small amount of vaginal bleeding today but no clots. She states it was spotting when she wiped. No treatment prior to arrival. She states she is taking prenatal vitamins. She also states that she did not have any vaginal bleeding or complications from her previous pregnancy.  She denies any dizziness, lightheadedness, fever, chest pain, abdominal pain, nausea, vomiting, diarrhea, dysuria, hematuria.  Past Medical History  Diagnosis Date  . IUGR (intrauterine growth restriction)   . Medical history non-contributory    Past Surgical History  Procedure Laterality Date  . No past surgeries     Family History  Problem Relation Age of Onset  . Hypertension Mother    Social History  Substance Use Topics  . Smoking status: Never Smoker   . Smokeless tobacco: Never Used  . Alcohol Use: No   OB History    Gravida Para Term Preterm AB TAB SAB Ectopic Multiple Living   1 1 1       1      Review of Systems  Constitutional: Negative for fever and chills.  Respiratory: Negative for shortness of breath.   Gastrointestinal: Negative for vomiting, abdominal pain and diarrhea.  Genitourinary: Positive for vaginal bleeding. Negative for dysuria and vaginal pain.  All other systems reviewed and are negative.     Allergies  Review of patient's allergies indicates no known  allergies.  Home Medications   Prior to Admission medications   Not on File   BP 109/72 mmHg  Pulse 90  Temp(Src) 99.4 F (37.4 C) (Oral)  Resp 16  SpO2 100%  LMP 08/22/2014 Physical Exam  Constitutional: She is oriented to person, place, and time. She appears well-developed and well-nourished. No distress.  HENT:  Head: Normocephalic and atraumatic.  Eyes: Conjunctivae are normal.  Neck: Normal range of motion. Neck supple.  Cardiovascular: Normal rate, regular rhythm and normal heart sounds.   Pulmonary/Chest: Effort normal and breath sounds normal.  Abdominal: Soft. There is no tenderness.  No abdominal tenderness to palpation. No guarding or rebound. Abdomen appears normal.  Genitourinary: There is bleeding in the vagina.  Pelvic exam: Chaperone present. Moderate amount of dark vaginal bleeding. No adnexal tenderness. Cervical os is closed.  Musculoskeletal: Normal range of motion.  Neurological: She is alert and oriented to person, place, and time.  Skin: Skin is warm and dry.  Psychiatric: She has a normal mood and affect. Her behavior is normal.  Nursing note and vitals reviewed.   ED Course  Procedures (including critical care time) Labs Review Labs Reviewed  WET PREP, GENITAL - Abnormal; Notable for the following:    Clue Cells Wet Prep HPF POC FEW (*)    WBC, Wet Prep HPF POC FEW (*)    All other components within normal limits  URINALYSIS, ROUTINE W REFLEX MICROSCOPIC (NOT AT [facility]) - Abnormal; Notable for the following:    Hgb urine  dipstick SMALL (*)    All other components within normal limits  I-STAT BETA HCG BLOOD, ED ([facility], [facility], [facility] ONLY) - Abnormal; Notable for the following:    I-stat hCG, quantitative >2000.0 (*)    All other components within normal limits  CBC  RPR  HIV ANTIBODY (ROUTINE TESTING)  URINE MICROSCOPIC-ADD ON  GC/CHLAMYDIA PROBE AMP () NOT AT [facility]    Imaging Review US Ob Comp Less 14 Wks  10/31/2014  CLINICAL DATA:   Pregnant patient with vaginal spotting starting today. EXAM: OBSTETRIC <14 WK [place] AND TRANSVAGINAL OB US TECHNIQUE: Both transabdominal and transvaginal ultrasound examinations were performed for complete evaluation of the gestation as well as the maternal uterus, adnexal regions, and pelvic cul-de-sac. Transvaginal technique was performed to assess early pregnancy. COMPARISON:  None. FINDINGS: Intrauterine gestational sac: Visualized, subjectively small in size. Yolk sac:  Present. Embryo:  Present. Cardiac Activity: Present. Heart Rate: 108  bpm CRL:  2.7  mm   5 w   6 d                  [place] EDC: 06/27/2015 Maternal uterus/adnexae: Complex heterogeneous material in the endometrial canal to the right of the gestational sac likely represents subchorionic hemorrhage. The right ovary is enlarged measuring 5.2 x 3.7 x 3.9 cm and contains multiple simple cysts. The largest cyst measures 3.6 x 3.9 x 3.5 cm. There is blood flow to the ovarian parenchyma. The left ovary is not visualized. There is no free fluid. IMPRESSION: 1. Single live intrauterine pregnancy estimated gestational age [redacted] weeks 6 days for estimated date of delivery 06/27/2015, however the gestational sac is subjectively small, and there is a moderate amount of probable subchorionic hemorrhage. Findings are concerning for threatened pregnancy. Recommend continued trending of beta HCG and short interval sonographic follow-up. 2. Multiple right ovarian cysts, largest 3.9 cm.  Blood flow seen. Electronically Signed   By: Jeb Levering M.D.   On: 10/31/2014 22:13   US Ob Transvaginal  10/31/2014  CLINICAL DATA:  Pregnant patient with vaginal spotting starting today. EXAM: OBSTETRIC <14 WK [place] AND TRANSVAGINAL OB US TECHNIQUE: Both transabdominal and transvaginal ultrasound examinations were performed for complete evaluation of the gestation as well as the maternal uterus, adnexal regions, and pelvic cul-de-sac. Transvaginal technique was performed to assess  early pregnancy. COMPARISON:  None. FINDINGS: Intrauterine gestational sac: Visualized, subjectively small in size. Yolk sac:  Present. Embryo:  Present. Cardiac Activity: Present. Heart Rate: 108  bpm CRL:  2.7  mm   5 w   6 d                  [place] EDC: 06/27/2015 Maternal uterus/adnexae: Complex heterogeneous material in the endometrial canal to the right of the gestational sac likely represents subchorionic hemorrhage. The right ovary is enlarged measuring 5.2 x 3.7 x 3.9 cm and contains multiple simple cysts. The largest cyst measures 3.6 x 3.9 x 3.5 cm. There is blood flow to the ovarian parenchyma. The left ovary is not visualized. There is no free fluid. IMPRESSION: 1. Single live intrauterine pregnancy estimated gestational age [redacted] weeks 6 days for estimated date of delivery 06/27/2015, however the gestational sac is subjectively small, and there is a moderate amount of probable subchorionic hemorrhage. Findings are concerning for threatened pregnancy. Recommend continued trending of beta HCG and short interval sonographic follow-up. 2. Multiple right ovarian cysts, largest 3.9 cm.  Blood flow seen. Electronically Signed   By: Threasa Beards  Ehinger M.D.   On: 10/31/2014 22:13   I have personally reviewed and evaluated these images and lab results as part of my medical decision-making.   EKG Interpretation None      MDM   Final diagnoses:  Threatened abortion in first trimester   Patient is [redacted] weeks pregnant and presents for vaginal bleeding that began today. Her vitals are stable and she is well-appearing. She is not anemic. Ultrasound was obtained to r/o ectopic pregnancy but shows a single live intrauterine pregnancy estimated at 5 weeks and 6 days. However, the gestational sac is small and there is a moderate amount of probable subchorionic hemorrhaging that is concerning for a threatened pregnancy. I discussed this with the patient as well as repeat hCG within 48 hours. I also discussed return  precautions if she continued to have a moderate amount of vaginal bleeding. She can take Tylenol for pain. She verbally agrees with the plan.    Ottie Glazier, PA-C 11/02/14 [place], MD 11/04/14 [phone]

## 2014-10-31 NOTE — Discharge Instructions (Signed)
Threatened Miscarriage Follow-up with her physician within 48 hours for repeat hCG. Return for dizziness, lightheadedness, shortness of breath, or chest pain. Take Tylenol for pain. A threatened miscarriage occurs when you have vaginal bleeding during your first 20 weeks of pregnancy but the pregnancy has not ended. If you have vaginal bleeding during this time, your health care provider will do tests to make sure you are still pregnant. If the tests show you are still pregnant and the developing baby (fetus) inside your womb (uterus) is still growing, your condition is considered a threatened miscarriage. A threatened miscarriage does not mean your pregnancy will end, but it does increase the risk of losing your pregnancy (complete miscarriage). CAUSES  The cause of a threatened miscarriage is usually not known. If you go on to have a complete miscarriage, the most common cause is an abnormal number of chromosomes in the developing baby. Chromosomes are the structures inside cells that hold all your genetic material. Some causes of vaginal bleeding that do not result in miscarriage include:  Having sex.  Having an infection.  Normal hormone changes of pregnancy.  Bleeding that occurs when an egg implants in your uterus. RISK FACTORS Risk factors for bleeding in early pregnancy include:  Obesity.  Smoking.  Drinking excessive amounts of alcohol or caffeine.  Recreational drug use. SIGNS AND SYMPTOMS  Light vaginal bleeding.  Mild abdominal pain or cramps. DIAGNOSIS  If you have bleeding with or without abdominal pain before 20 weeks of pregnancy, your health care provider will do tests to check whether you are still pregnant. One important test involves using sound waves and a computer (ultrasound) to create images of the inside of your uterus. Other tests include an internal exam of your vagina and uterus (pelvic exam) and measurement of your baby's heart rate.  You may be diagnosed  with a threatened miscarriage if:  Ultrasound testing shows you are still pregnant.  Your baby's heart rate is strong.  A pelvic exam shows that the opening between your uterus and your vagina (cervix) is closed.  Your heart rate and blood pressure are stable.  Blood tests confirm you are still pregnant. TREATMENT  No treatments have been shown to prevent a threatened miscarriage from going on to a complete miscarriage. However, the right home care is important.  HOME CARE INSTRUCTIONS   Make sure you keep all your appointments for prenatal care. This is very important.  Get plenty of rest.  Do not have sex or use tampons if you have vaginal bleeding.  Do not douche.  Do not smoke or use recreational drugs.  Do not drink alcohol.  Avoid caffeine. SEEK MEDICAL CARE IF:  You have light vaginal bleeding or spotting while pregnant.  You have abdominal pain or cramping.  You have a fever. SEEK IMMEDIATE MEDICAL CARE IF:  You have heavy vaginal bleeding.  You have blood clots coming from your vagina.  You have severe low back pain or abdominal cramps.  You have fever, chills, and severe abdominal pain. MAKE SURE YOU:  Understand these instructions.  Will watch your condition.  Will get help right away if you are not doing well or get worse.   This information is not intended to replace advice given to you by your health care provider. Make sure you discuss any questions you have with your health care provider.   Document Released: 12/28/2004 Document Revised: 01/02/2013 Document Reviewed: 10/24/2012 Elsevier Interactive Patient Education [employer].

## 2014-10-31 NOTE — ED Notes (Signed)
Pt able to dress and ambulate independently 

## 2014-10-31 NOTE — ED Notes (Signed)
Pt reports [redacted] weeks pregnant, had small amount vaginal bleeding today. No clots. Denies abd pain. Pt is a x 4.

## 2014-11-01 LAB — RPR: RPR Ser Ql: NONREACTIVE

## 2014-11-01 LAB — HIV ANTIBODY (ROUTINE TESTING W REFLEX): HIV Screen 4th Generation wRfx: NONREACTIVE

## 2014-11-01 LAB — GC/CHLAMYDIA PROBE AMP (~~LOC~~) NOT AT ARMC
Chlamydia: NEGATIVE
Neisseria Gonorrhea: NEGATIVE

## 2014-11-02 ENCOUNTER — Encounter (HOSPITAL_COMMUNITY): Payer: Self-pay | Admitting: *Deleted

## 2014-11-02 ENCOUNTER — Inpatient Hospital Stay (HOSPITAL_COMMUNITY)
Admission: AD | Admit: 2014-11-02 | Discharge: 2014-11-02 | Disposition: A | Discharge status: Home / Self Care | Payer: Medicaid Other | Source: Ambulatory Visit | Attending: Family Medicine | Admitting: Family Medicine

## 2014-11-02 DIAGNOSIS — Z3A10 10 weeks gestation of pregnancy: Secondary | ICD-10-CM | POA: Diagnosis not present

## 2014-11-02 DIAGNOSIS — O208 Other hemorrhage in early pregnancy: Secondary | ICD-10-CM | POA: Insufficient documentation

## 2014-11-02 NOTE — MAU Provider Note (Signed)
Consultation: Client seen at [facility] 2 days ago for vaginal bleeding in pregnancy.  Reviewed her ultrasound there which showed viable IUP with small subchorionic hemorrhage.  Since then her bleeding has been less and is only brown when she wipes.  She does not have abdominal pain.  Was sent here for quant.  Discussed ultrasound with client and recommended no blood work here today.  Client is in agreement.  Given verification of pregnancy and list of OB providers.  To begin prenatal care as soon as possible.  Earlie Server, NP

## 2014-11-02 NOTE — MAU Note (Addendum)
Preg, having a little bit of bleeding.  Started on Thursday. Initially was heavy and red, now is when she wipes and brown.no pain now.  Was seen at [facility] on 10/20.  Instructed to f/u here

## 2014-11-29 ENCOUNTER — Encounter (HOSPITAL_COMMUNITY): Payer: Self-pay | Admitting: *Deleted

## 2014-11-29 ENCOUNTER — Inpatient Hospital Stay (HOSPITAL_COMMUNITY)
Admission: AD | Admit: 2014-11-29 | Discharge: 2014-11-29 | Disposition: A | Discharge status: Home / Self Care | Payer: Medicaid Other | Source: Ambulatory Visit | Attending: Obstetrics and Gynecology | Admitting: Obstetrics and Gynecology

## 2014-11-29 DIAGNOSIS — O210 Mild hyperemesis gravidarum: Secondary | ICD-10-CM | POA: Insufficient documentation

## 2014-11-29 DIAGNOSIS — Z3A10 10 weeks gestation of pregnancy: Secondary | ICD-10-CM | POA: Diagnosis not present

## 2014-11-29 DIAGNOSIS — O219 Vomiting of pregnancy, unspecified: Secondary | ICD-10-CM | POA: Diagnosis not present

## 2014-11-29 LAB — URINALYSIS, ROUTINE W REFLEX MICROSCOPIC
BILIRUBIN URINE: NEGATIVE
Glucose, UA: NEGATIVE mg/dL
Hgb urine dipstick: NEGATIVE
Leukocytes, UA: NEGATIVE
NITRITE: NEGATIVE
PH: 6.5 (ref 5.0–8.0)
Protein, ur: NEGATIVE mg/dL
Specific Gravity, Urine: 1.02 (ref 1.005–1.030)

## 2014-11-29 LAB — COMPREHENSIVE METABOLIC PANEL
ALT: 11 U/L — AB (ref 14–54)
AST: 17 U/L (ref 15–41)
Albumin: 4.1 g/dL (ref 3.5–5.0)
Alkaline Phosphatase: 54 U/L (ref 38–126)
Anion gap: 11 (ref 5–15)
BUN: 6 mg/dL (ref 6–20)
CHLORIDE: 104 mmol/L (ref 101–111)
CO2: 22 mmol/L (ref 22–32)
CREATININE: 0.53 mg/dL (ref 0.44–1.00)
Calcium: 10 mg/dL (ref 8.9–10.3)
GFR calc non Af Amer: >60 mL/min (ref >60)
Glucose, Bld: 91 mg/dL (ref 65–99)
POTASSIUM: 3.5 mmol/L (ref 3.5–5.1)
SODIUM: 137 mmol/L (ref 135–145)
Total Bilirubin: 0.8 mg/dL (ref 0.3–1.2)
Total Protein: 7.9 g/dL (ref 6.5–8.1)

## 2014-11-29 LAB — CBC
HCT: 35.9 % — ABNORMAL LOW (ref 36.0–46.0)
Hemoglobin: 12.1 g/dL (ref 12.0–15.0)
MCH: 26.9 pg (ref 26.0–34.0)
MCHC: 33.7 g/dL (ref 30.0–36.0)
MCV: 80 fL (ref 78.0–100.0)
PLATELETS: 294 10*3/uL (ref 150–400)
RBC: 4.49 MIL/uL (ref 3.87–5.11)
RDW: 13.2 % (ref 11.5–15.5)
WBC: 8.8 10*3/uL (ref 4.0–10.5)

## 2014-11-29 MED ORDER — PROMETHAZINE HCL 12.5 MG PO TABS: 12.5000 mg | ORAL_TABLET | Freq: Four times a day (QID) | ORAL | Status: DC | PRN

## 2014-11-29 MED ORDER — DEXTROSE IN LACTATED RINGERS 5 % IV SOLN
25.0000 mg | Freq: Once | INTRAVENOUS | Status: AC
  Administered 2014-11-29: 25 mg via INTRAVENOUS
  Filled 2014-11-29: qty 1

## 2014-11-29 MED ORDER — LACTATED RINGERS IV BOLUS (SEPSIS)
1000.0000 mL | Freq: Once | INTRAVENOUS | Status: AC
  Administered 2014-11-29: 1000 mL via INTRAVENOUS

## 2014-11-29 NOTE — Discharge Instructions (Signed)
Morning Sickness °Morning sickness is when you feel sick to your stomach (nauseous) during pregnancy. You may feel sick to your stomach and throw up (vomit). You may feel sick in the morning, but you can feel this way any time of day. Some women feel very sick to their stomach and cannot stop throwing up (hyperemesis gravidarum). °HOME CARE °· Only take medicines as told by your doctor. °· Take multivitamins as told by your doctor. Taking multivitamins before getting pregnant can stop or lessen the harshness of morning sickness. °· Eat dry toast or unsalted crackers before getting out of bed. °· Eat 5 to 6 small meals a day. °· Eat dry and bland foods like rice and baked potatoes. °· Do not drink liquids with meals. Drink between meals. °· Do not eat greasy, fatty, or spicy foods. °· Have someone cook for you if the smell of food causes you to feel sick or throw up. °· If you feel sick to your stomach after taking prenatal vitamins, take them at night or with a snack. °· Eat protein when you need a snack (nuts, yogurt, cheese). °· Eat unsweetened gelatins for dessert. °· Wear a bracelet used for sea sickness (acupressure wristband). °· Go to a doctor that puts thin needles into certain body points (acupuncture) to improve how you feel. °· Do not smoke. °· Use a humidifier to keep the air in your house free of odors. °· Get lots of fresh air. °GET HELP IF: °· You need medicine to feel better. °· You feel dizzy or lightheaded. °· You are losing weight. °GET HELP RIGHT AWAY IF:  °· You feel very sick to your stomach and cannot stop throwing up. °· You pass out (faint). °MAKE SURE YOU: °· Understand these instructions. °· Will watch your condition. °· Will get help right away if you are not doing well or get worse. °  °This information is not intended to replace advice given to you by your health care provider. Make sure you discuss any questions you have with your health care provider. °  °Document Released: 02/05/2004  Document Revised: 01/18/2014 Document Reviewed: 06/14/2012 °Elsevier Interactive Patient Education ©2016 [employer] °Eating Plan for Hyperemesis Gravidarum °Severe cases of hyperemesis gravidarum can lead to dehydration and malnutrition. The hyperemesis eating plan is one way to lessen the symptoms of nausea and vomiting. It is often used with prescribed medicines to control your symptoms.  °WHAT CAN I DO TO RELIEVE MY SYMPTOMS? °Listen to your body. Everyone is different and has different preferences. Find what works best for you. Some of the following things may help: °· Eat and drink slowly. °· Eat 5-6 small meals daily instead of 3 large meals.   °· Eat crackers before you get out of bed in the morning.   °· Starchy foods are usually well tolerated (such as cereal, toast, bread, potatoes, pasta, rice, and pretzels).   °· Ginger may help with nausea. Add ¼ tsp ground ginger to hot tea or choose ginger tea.   °· Try drinking 100% fruit juice or an electrolyte drink. °· Continue to take your prenatal vitamins as directed by your health care provider. If you are having trouble taking your prenatal vitamins, talk with your health care provider about different options. °· Include at least 1 serving of protein with your meals and snacks (such as meats or poultry, beans, nuts, eggs, or yogurt). Try eating a protein-rich snack before bed (such as cheese and crackers or a half turkey or peanut butter sandwich). °  WHAT THINGS SHOULD I AVOID TO REDUCE MY SYMPTOMS? °The following things may help reduce your symptoms: °· Avoid foods with strong smells. Try eating meals in well-ventilated areas that are free of odors. °· Avoid drinking water or other beverages with meals. Try not to drink anything less than 30 minutes before and after meals. °· Avoid drinking more than 1 cup of fluid at a time. °· Avoid fried or high-fat foods, such as butter and cream sauces. °· Avoid spicy foods. °· Avoid skipping meals the best you can.  Nausea can be more intense on an empty stomach. If you cannot tolerate food at that time, do not force it. Try sucking on ice chips or other frozen items and make up the calories later. °· Avoid lying down within 2 hours after eating. °  °This information is not intended to replace advice given to you by your health care provider. Make sure you discuss any questions you have with your health care provider. °  °Document Released: 10/25/2006 Document Revised: 01/02/2013 Document Reviewed: 11/01/2012 °Elsevier Interactive Patient Education ©2016 [employer] ° °

## 2014-11-29 NOTE — MAU Note (Addendum)
"  I'm sick and I can't eat nothing".5-7 days. Has not taken her meds in 2 days. Dizzy at times.  Temp of ? 100 this morning.

## 2014-11-29 NOTE — MAU Provider Note (Signed)
History     CSN: [id number]  Arrival date and time: 11/29/14 1558   First Provider Initiated Contact with Patient 11/29/14 1846      Chief Complaint  Patient presents with  . Emesis During Pregnancy   HPI Shelley Bishop is a 24 y.o. G2P1001 at [redacted]w[redacted]d who presents to MAU today with complaint of nausea and vomiting. The patient states that this has been an issue throughout the pregnancy. She was taking Diclegis, but stopped 2 days ago because it wasn't working. She has not tried any other medications. She denies fever, diarrhea, abdominal pain or vaginal bleeding.   OB History    Gravida Para Term Preterm AB TAB SAB Ectopic Multiple Living   2 1 1       1       Past Medical History  Diagnosis Date  . IUGR (intrauterine growth restriction)   . Medical history non-contributory     Past Surgical History  Procedure Laterality Date  . No past surgeries      Family History  Problem Relation Age of Onset  . Hypertension Mother     Social History  Substance Use Topics  . Smoking status: Never Smoker   . Smokeless tobacco: Never Used  . Alcohol Use: No    Allergies: No Known Allergies  Prescriptions prior to admission  Medication Sig Dispense Refill Last Dose  . Doxylamine-Pyridoxine (DICLEGIS) 10-10 MG TBEC Take 1 tablet by mouth at bedtime.   Past Week at Unknown time    Review of Systems  Constitutional: Negative for fever and malaise/fatigue.  Gastrointestinal: Positive for nausea and vomiting. Negative for abdominal pain, diarrhea and constipation.  Genitourinary: Negative for dysuria, urgency and frequency.       Neg - vaginal bleeding, discharge   Physical Exam   Blood pressure 100/57, pulse 87, temperature 98.1 F (36.7 C), temperature source Oral, resp. rate 18, height 5\' 2"  (1.575 m), weight 140 lb 6.4 oz (63.685 kg), last menstrual period 08/22/2014.  Physical Exam  Nursing note and vitals reviewed. Constitutional: She is oriented to person,  place, and time. She appears well-developed and well-nourished. No distress.  HENT:  Head: Normocephalic and atraumatic.  Cardiovascular: Normal rate.   Respiratory: Effort normal.  GI: Soft. She exhibits no distension and no mass. There is no tenderness. There is no rebound and no guarding.  Neurological: She is alert and oriented to person, place, and time.  Skin: Skin is warm and dry. No erythema.  Psychiatric: She has a normal mood and affect.    Results for orders placed or performed during the hospital encounter of 11/29/14 (from the past 24 hour(s))  Urinalysis, Routine w reflex microscopic (not at [facility])     Status: Abnormal   Collection Time: 11/29/14  4:20 PM  Result Value Ref Range   Color, Urine YELLOW YELLOW   APPearance CLOUDY (A) CLEAR   Specific Gravity, Urine 1.020 1.005 - 1.030   pH 6.5 5.0 - 8.0   Glucose, UA NEGATIVE NEGATIVE mg/dL   Hgb urine dipstick NEGATIVE NEGATIVE   Bilirubin Urine NEGATIVE NEGATIVE   Ketones, ur >80 (A) NEGATIVE mg/dL   Protein, ur NEGATIVE NEGATIVE mg/dL   Nitrite NEGATIVE NEGATIVE   Leukocytes, UA NEGATIVE NEGATIVE  CBC     Status: Abnormal   Collection Time: 11/29/14  6:55 PM  Result Value Ref Range   WBC 8.8 4.0 - 10.5 K/uL   RBC 4.49 3.87 - 5.11 MIL/uL   Hemoglobin 12.1 12.0 -  15.0 g/dL   HCT 35.9 (L) 36.0 - 46.0 %   MCV 80.0 78.0 - 100.0 fL   MCH 26.9 26.0 - 34.0 pg   MCHC 33.7 30.0 - 36.0 g/dL   RDW 13.2 11.5 - 15.5 %   Platelets 294 150 - 400 K/uL  Comprehensive metabolic panel     Status: Abnormal   Collection Time: 11/29/14  6:55 PM  Result Value Ref Range   Sodium 137 135 - 145 mmol/L   Potassium 3.5 3.5 - 5.1 mmol/L   Chloride 104 101 - 111 mmol/L   CO2 22 22 - 32 mmol/L   Glucose, Bld 91 65 - 99 mg/dL   BUN 6 6 - 20 mg/dL   Creatinine, Ser 0.53 0.44 - 1.00 mg/dL   Calcium 10.0 8.9 - 10.3 mg/dL   Total Protein 7.9 6.5 - 8.1 g/dL   Albumin 4.1 3.5 - 5.0 g/dL   AST 17 15 - 41 U/L   ALT 11 (L) 14 - 54 U/L    Alkaline Phosphatase 54 38 - 126 U/L   Total Bilirubin 0.8 0.3 - 1.2 mg/dL   GFR calc non Af Amer >60 >60 mL/min   GFR calc Af Amer >60 >60 mL/min   Anion gap 11 5 - 15    MAU Course  Procedures None  MDM UA, CBC, CMP today 1 liter IV D5LR with 25 mg Phenergan infusion Patient reports significant improvement in nausea and no emesis while in MAU Assessment and Plan  A: SIUP at [redacted]w[redacted]d Nausea and vomiting in pregnancy prior to [redacted] weeks gestation  P: Discharge home Rx for Phenergan given to patient Diet for N/V in pregnancy discussed Patient advised to follow-up with [facility] as scheduled for routine prenatal care or sooner PRN Patient may return to MAU as needed or if her condition were to change or worsen   Luvenia Redden, PA-C  11/29/2014, 8:36 PM

## 2014-12-03 LAB — OB RESULTS CONSOLE RUBELLA ANTIBODY, IGM: RUBELLA: IMMUNE

## 2014-12-03 LAB — OB RESULTS CONSOLE ABO/RH: RH TYPE: POSITIVE

## 2014-12-03 LAB — OB RESULTS CONSOLE ANTIBODY SCREEN: ANTIBODY SCREEN: NEGATIVE

## 2014-12-03 LAB — OB RESULTS CONSOLE GC/CHLAMYDIA
Chlamydia: NEGATIVE
GC PROBE AMP, GENITAL: NEGATIVE

## 2014-12-03 LAB — OB RESULTS CONSOLE HIV ANTIBODY (ROUTINE TESTING): HIV: NONREACTIVE

## 2014-12-03 LAB — OB RESULTS CONSOLE RPR: RPR: NONREACTIVE

## 2014-12-03 LAB — OB RESULTS CONSOLE HEPATITIS B SURFACE ANTIGEN: HEP B S AG: NEGATIVE

## 2015-01-12 NOTE — L&D Delivery Note (Signed)
Delivery Note At 7:16 AM a viable female was delivered via Vaginal, Spontaneous Delivery (Presentation: vtx ).  APGAR: 9, 9; weight pending.  OB Fellow delivered the baby, I arrived about 1-2 minutes after baby delivered and delivered the placenta. Placenta status: Intact, Spontaneous.  Cord: 3 vessels with the following complications: None.  Anesthesia: None  Episiotomy: None Lacerations: 1st degree Suture Repair: 3.0 vicryl rapide Est. Blood Loss (mL): 200  Mom to postpartum.  Baby to Couplet care / Skin to Skin.  Tiler Brandis D 06/15/2015, 7:38 AM

## 2015-02-18 IMAGING — US US OB LIMITED
1 series · 13 of 23 positions shown · non-contrast
Comparison: none

[Series 1: us ob limited · 0.23mm/px · 23 acquisitions, 13 frames shown]
[im 1/23]
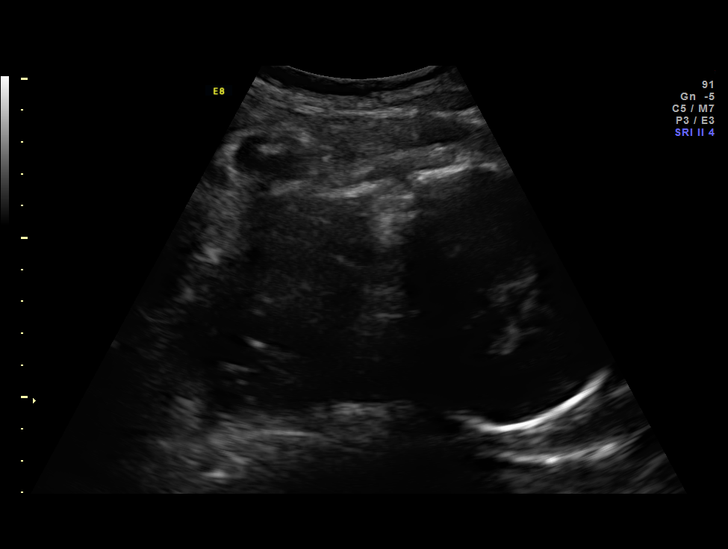
[im 3/23]
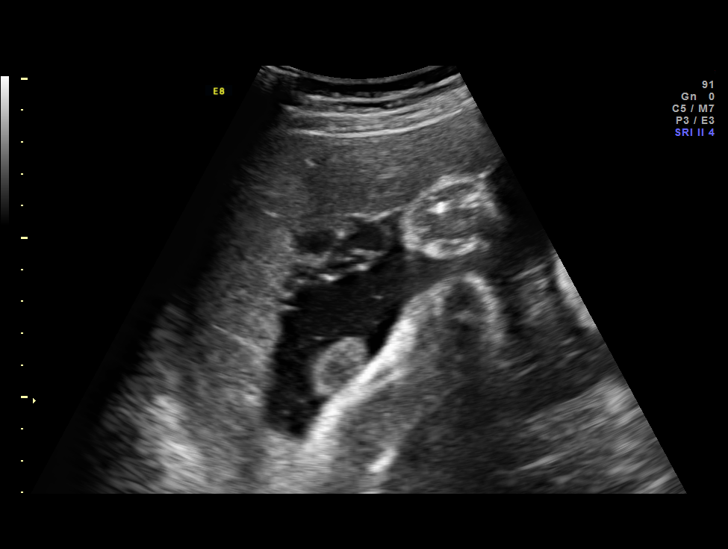
[im 5/23]
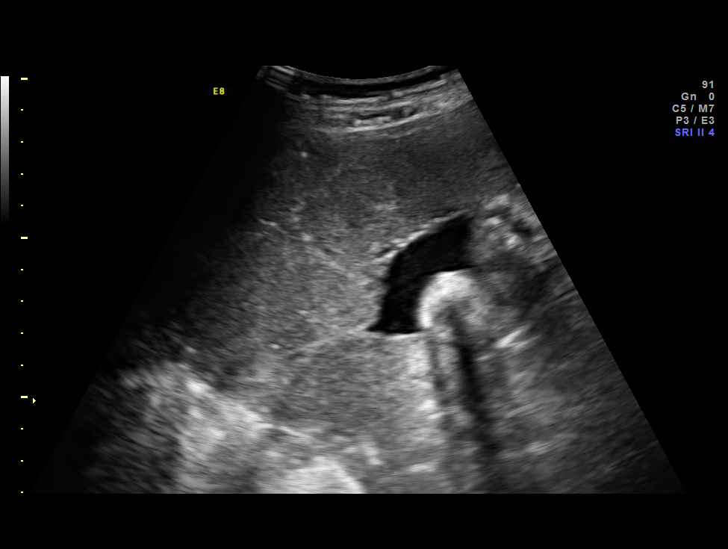
[im 7/23]
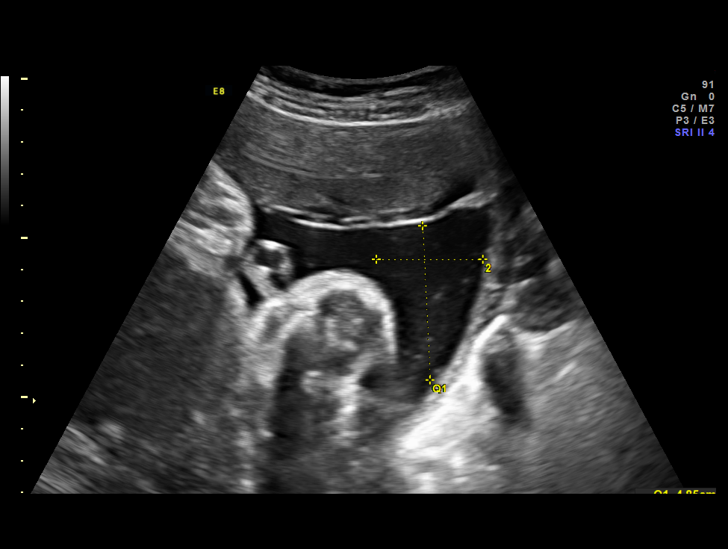
[im 8/23]
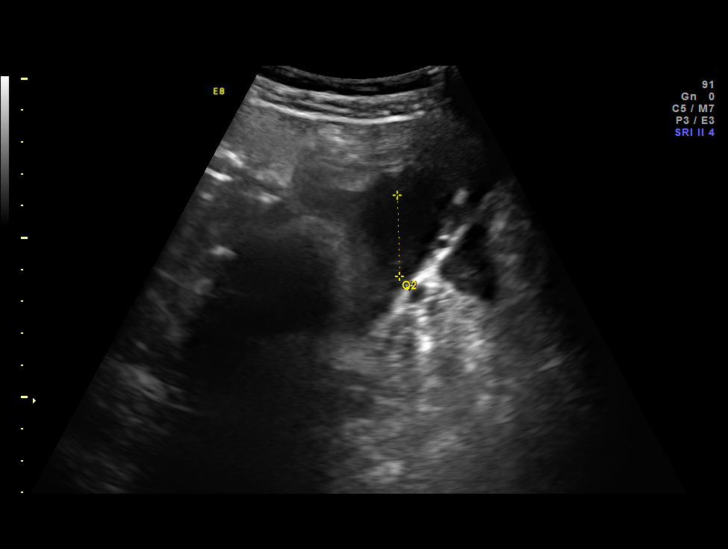
[im 10/23]
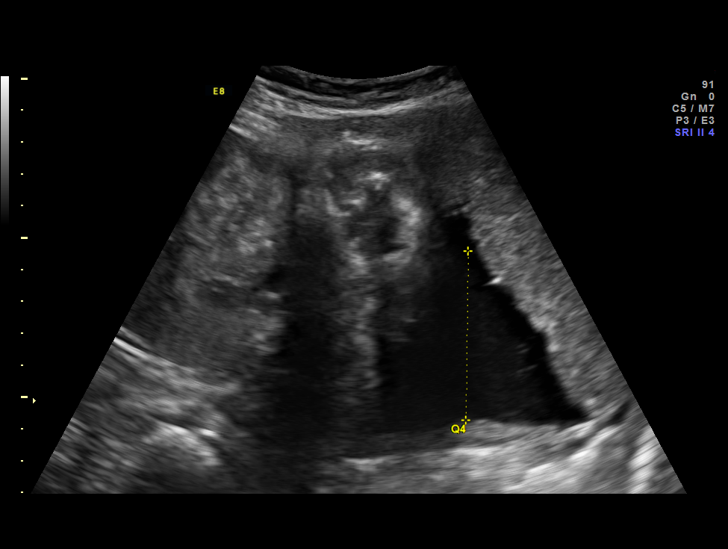
[im 12/23]
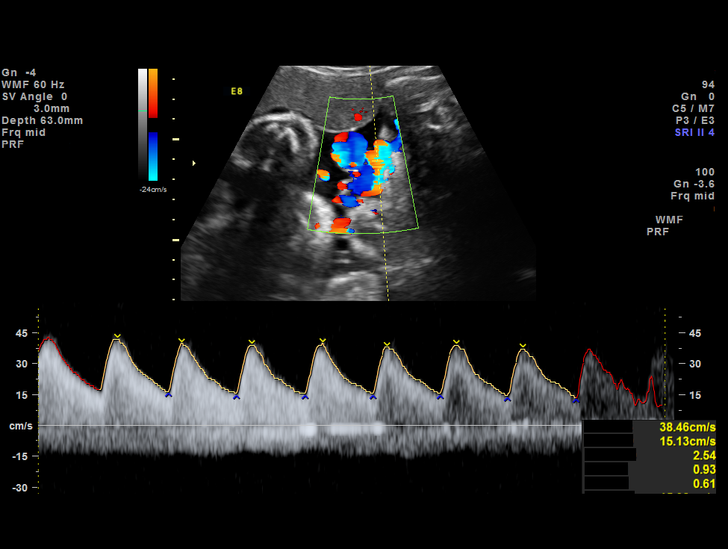
[im 14/23]
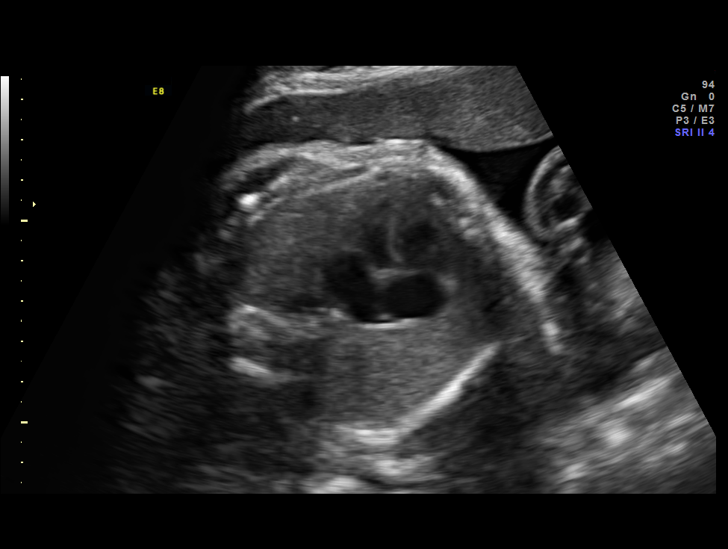
[im 16/23]
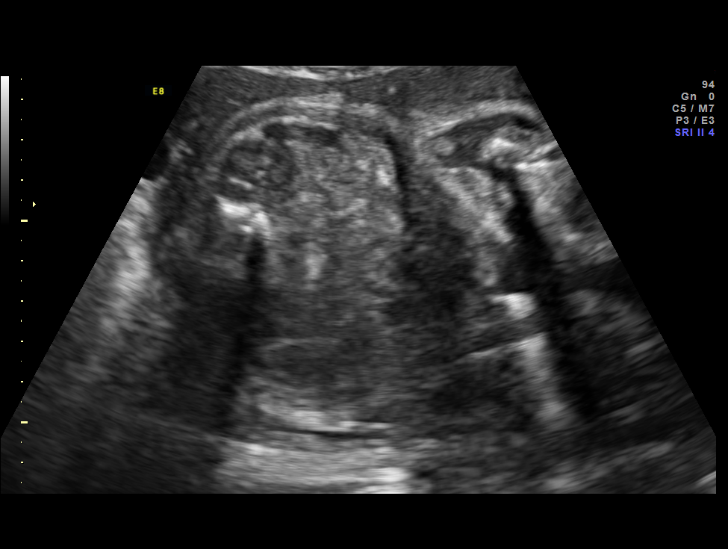
[im 17/23]
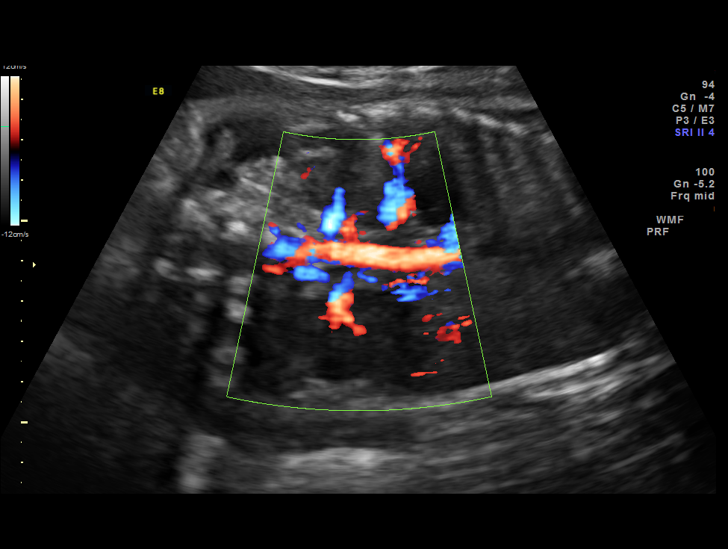
[im 19/23]
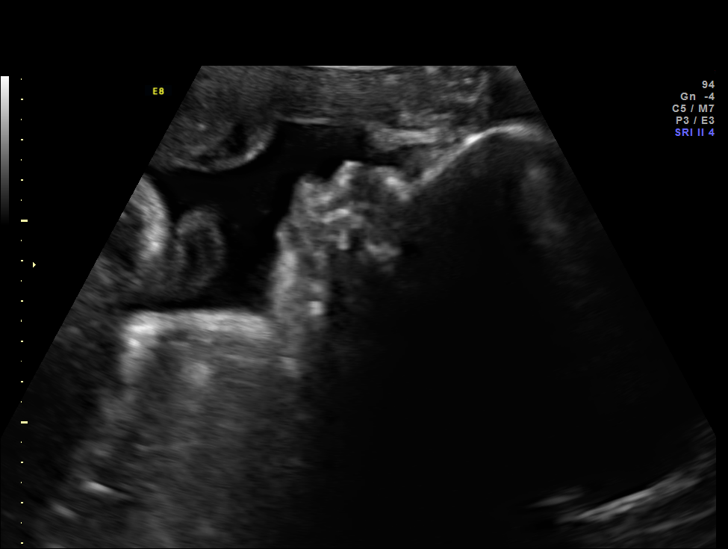
[im 21/23]
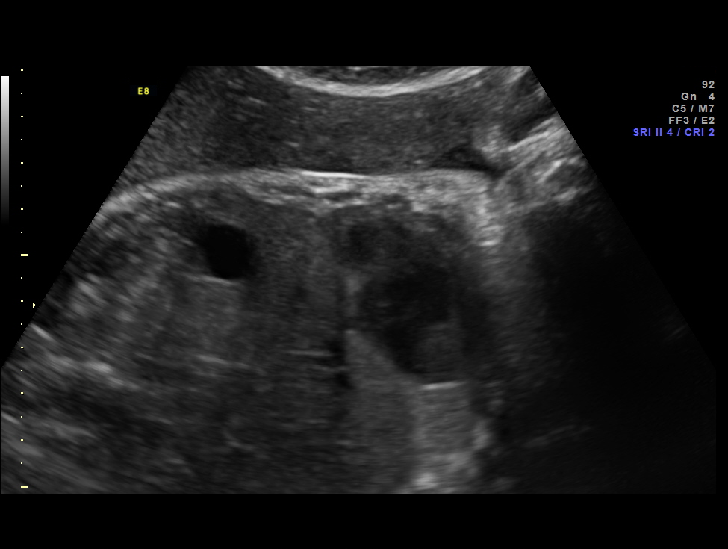
[im 23/23]
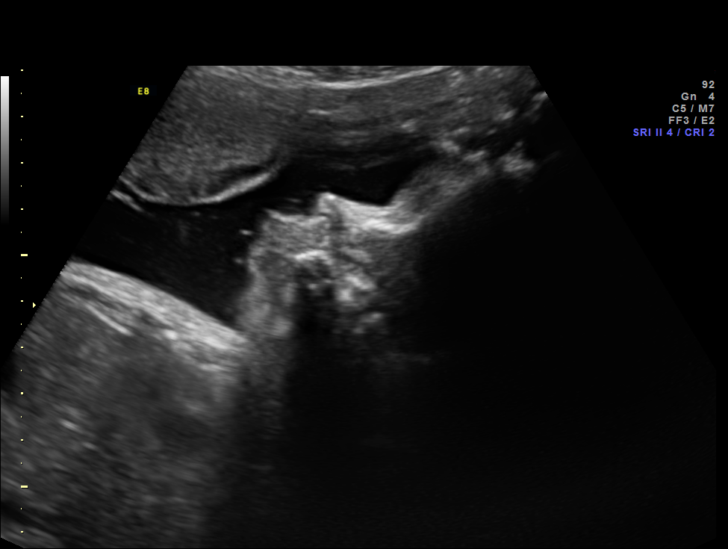

[13 of 23 positions shown; findings below may reference images not displayed]

OBSTETRICS REPORT
                      (Signed Final 11/09/2012 [DATE])

             FUJIWARA

Service(s) Provided

 [HOSPITAL]                                         76815.0
 US UA CORD DOPPLER                                    76820.0
Indications

 Fetal Growth Restriction
Fetal Evaluation

 Num Of Fetuses:    1
 Fetal Heart Rate:  167                          bpm
 Cardiac Activity:  Observed
 Presentation:      Cephalic
 Placenta:          Anterior, above cervical os
 P. Cord            Previously Visualized
 Insertion:

 Amniotic Fluid
 AFI FV:      Subjectively within normal limits
 AFI Sum:     14.91   cm       54  %Tile     Larg Pckt:    5.31  cm
 RUQ:   4.85    cm   RLQ:    5.31   cm    LUQ:   2.55    cm   LLQ:    2.2    cm
Gestational Age

 LMP:           34w 6d        Date:  03/10/12                 EDD:   12/15/12
 Best:          34w 6d     Det. By:  LMP  (03/10/12)          EDD:   12/15/12
Doppler - Fetal Vessels

 Umbilical Artery
 S/D:   2.62           57  %tile       RI:
 PI:    0.91                           PSV:       53.92   cm/s

Cervix Uterus Adnexa

 Cervix:       Not visualized (advanced GA >97wks)
Impression

 IUP at 34+6 weeks
 Normal amniotic fluid volume
 UA dopplers were normal for this GA
 NST reactive
Recommendations

 Continue twice weekly NSTs with weekly AFIs
 Growth US on [DATE]

## 2015-02-25 IMAGING — US US OB FOLLOW-UP
1 series · 12 of 27 positions shown · non-contrast
Comparison: none

[Series 1: us ob follow-up · 12 of 27 slices shown]
[im 2/27]
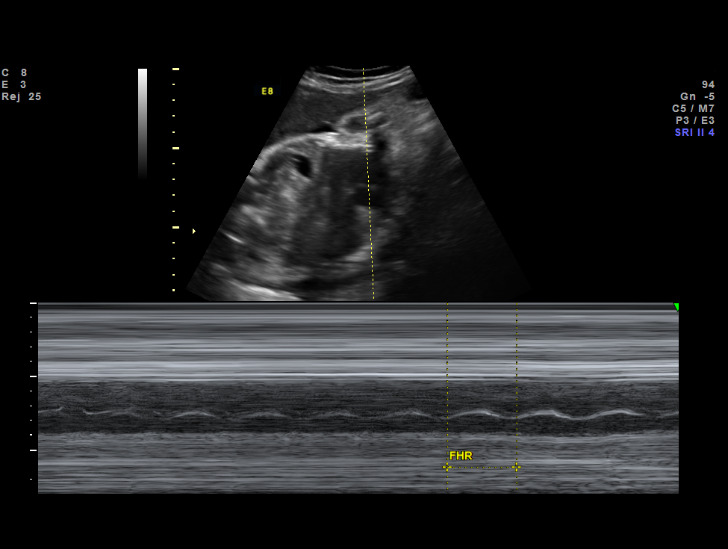
[im 4/27]
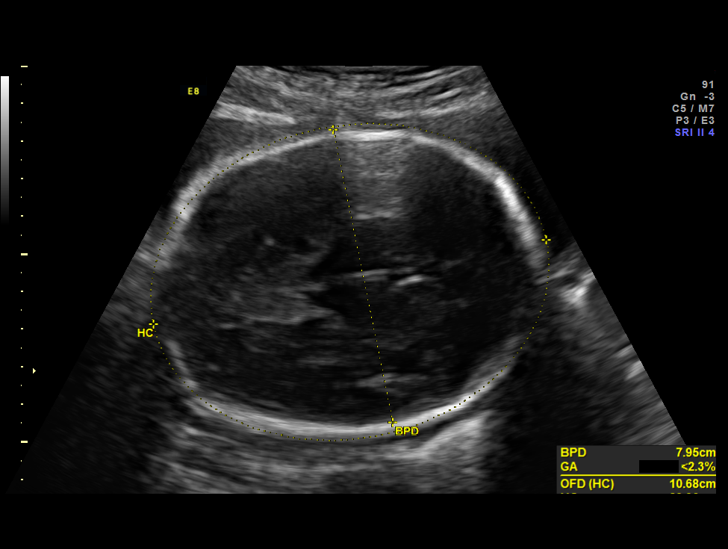
[im 6/27]
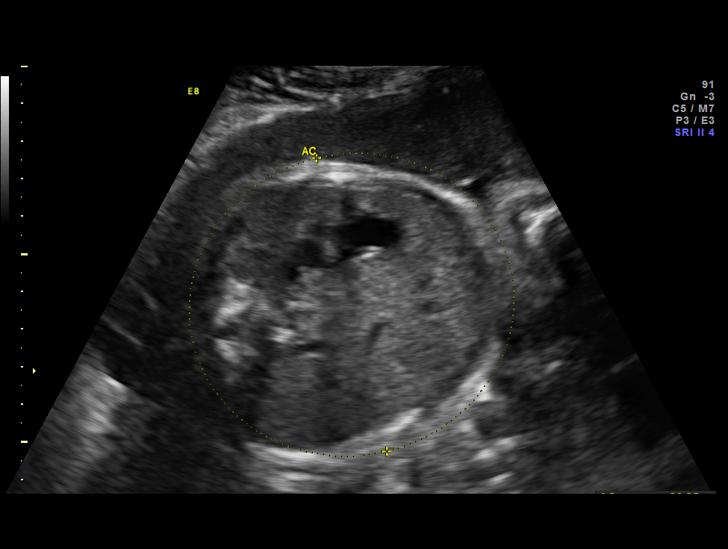
[im 8/27]
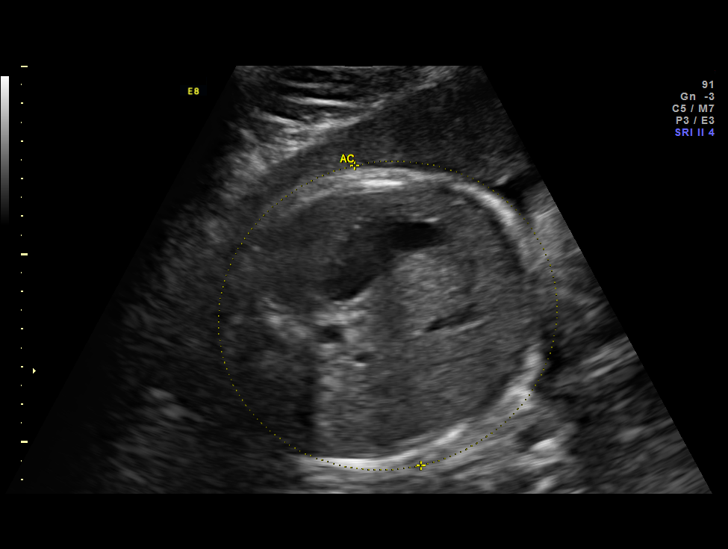
[im 11/27]
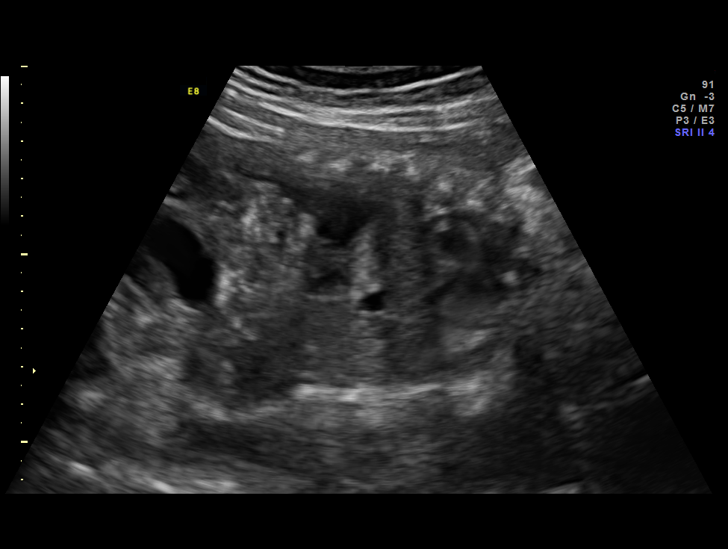
[im 13/27]
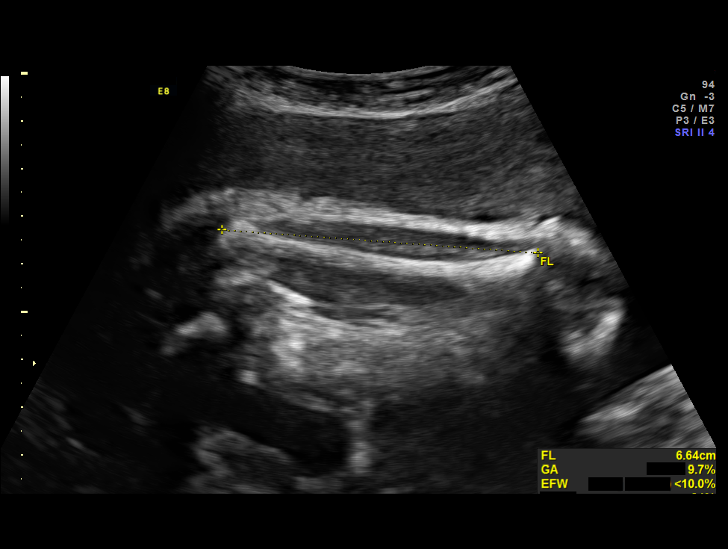
[im 15/27]
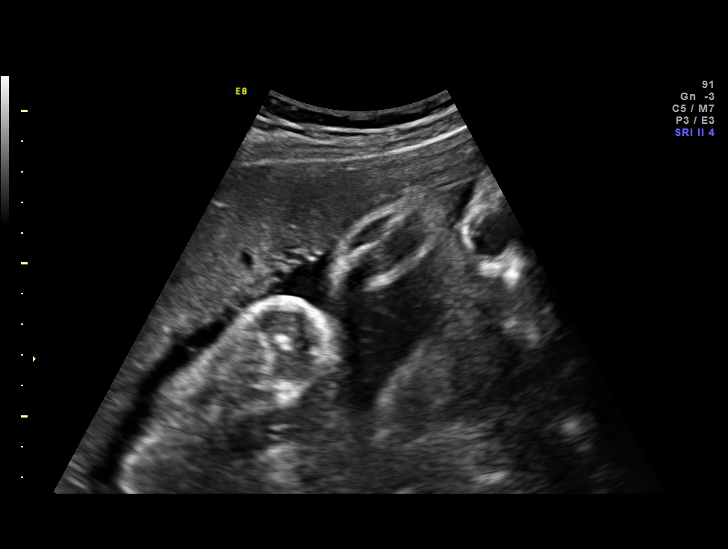
[im 17/27]
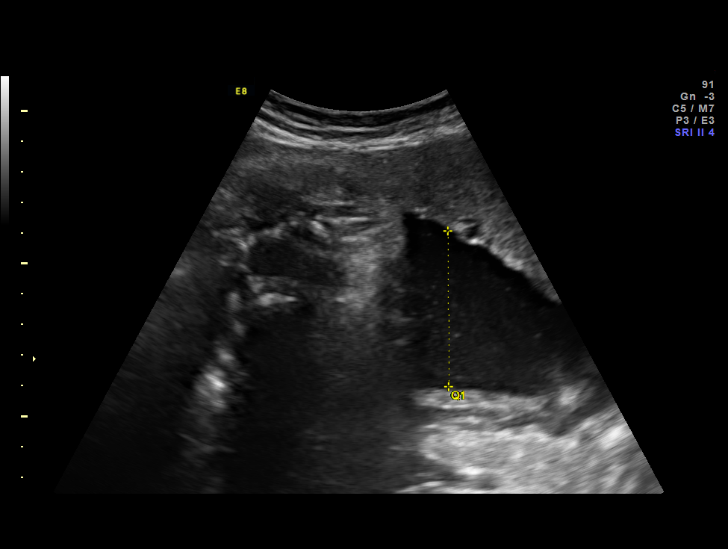
[im 20/27]
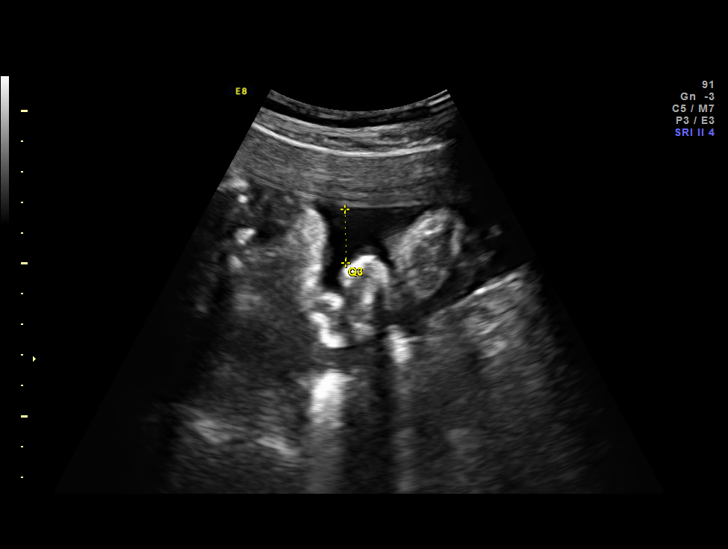
[im 22/27]
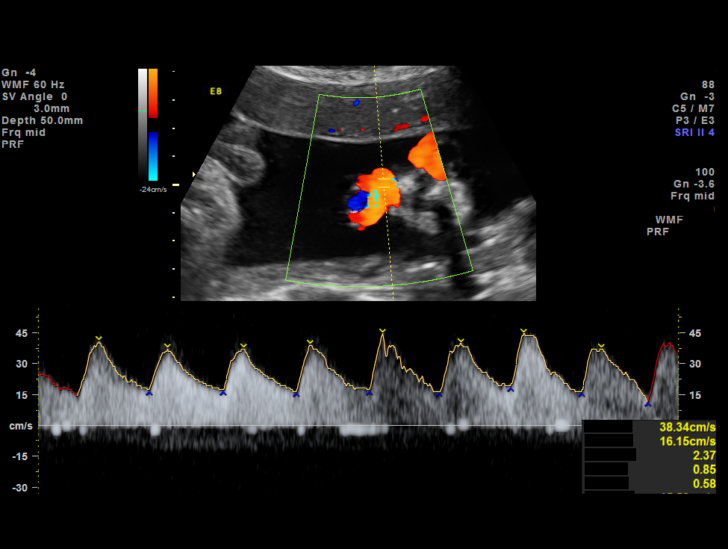
[im 24/27]
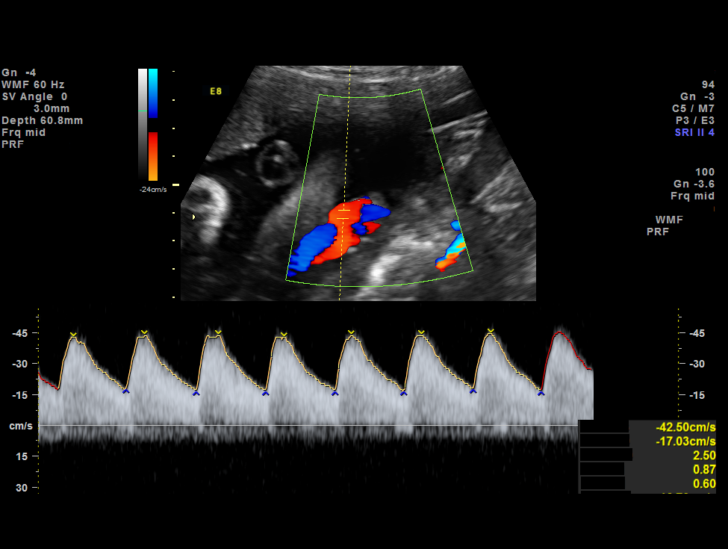
[im 26/27]
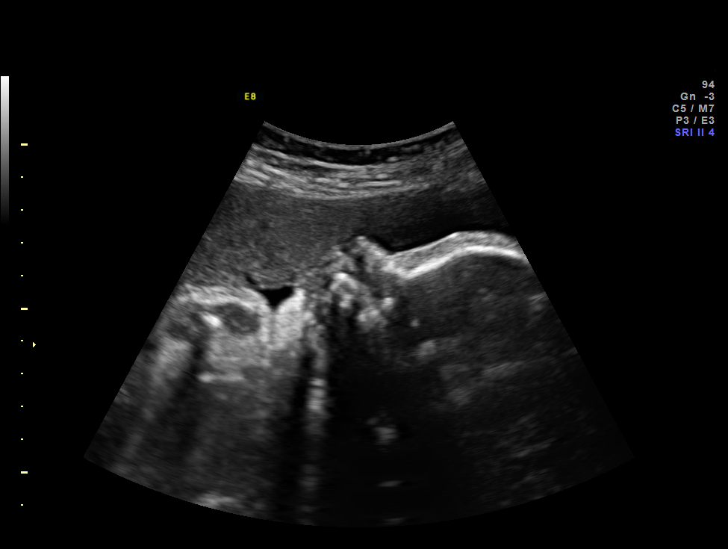

[12 of 27 positions shown; findings below may reference images not displayed]

OBSTETRICS REPORT
                      (Signed Final 11/16/2012 [DATE])

             TIGER

Service(s) Provided

 US OB FOLLOW UP                                       76816.1
 US UA CORD DOPPLER                                    76820.0
Indications

 Fetal Growth Restriction
Fetal Evaluation

 Num Of Fetuses:    1
 Fetal Heart Rate:  145                          bpm
 Cardiac Activity:  Observed
 Presentation:      Cephalic
 Placenta:          Anterior, above cervical os
 P. Cord            Previously Visualized
 Insertion:

 Amniotic Fluid
 AFI FV:      Subjectively within normal limits
 AFI Sum:     14.5    cm       53  %Tile     Larg Pckt:     5.1  cm
 RUQ:   5.1     cm   RLQ:    4.05   cm    LUQ:   3.59    cm   LLQ:    1.76   cm
Biophysical Evaluation

 Amniotic F.V:   Within normal limits       F. Tone:        Observed
 F. Movement:    Observed                   Score:          [DATE]
 F. Breathing:   Observed
Biometry

 BPD:     78.9  mm     G. Age:  31w 5d                CI:         73.7   70 - 86
 OFD:    107.1  mm                                    FL/HC:      22.1   20.1 -

 HC:     301.1  mm     G. Age:  33w 3d      < 3  %    HC/AC:      1.12   0.93 -

 AC:     269.6  mm     G. Age:  31w 0d      < 3  %    FL/BPD:     84.2   71 - 87
 FL:      66.4  mm     G. Age:  34w 1d       10  %    FL/AC:      24.6   20 - 24
 HUM:     58.1  mm     G. Age:  33w 5d       26  %

 Est. FW:    6779  gm      4 lb 5 oz   < 10  %
Gestational Age

 LMP:           35w 6d        Date:  03/10/12                 EDD:   12/15/12
 U/S Today:     32w 4d                                        EDD:   01/07/13
 Best:          35w 6d     Det. By:  LMP  (03/10/12)          EDD:   12/15/12
Anatomy

 Cranium:          Previously seen        Aortic Arch:      Previously seen
 Fetal Cavum:      Previously seen        Ductal Arch:      Previously seen
 Ventricles:       Previously seen        Diaphragm:        Appears normal
 Choroid Plexus:   Previously seen        Stomach:          Appears normal, left
                                                            sided
 Cerebellum:       Previously seen        Abdomen:          Previously seen
 Posterior Fossa:  Previously seen        Abdominal Wall:   Previously seen
 Nuchal Fold:      Previously seen        Cord Vessels:     Previously seen
 Face:             Orbits and profile     Kidneys:          Appear normal
                   previously seen
 Lips:             Previously seen        Bladder:          Appears normal
 Heart:            Appears normal         Spine:            Previously seen
                   (4CH, axis, and
                   situs)
 RVOT:             Previously seen        Lower             Previously seen
                                          Extremities:
 LVOT:             Previously seen        Upper             Previously seen
                                          Extremities:

 Other:  Male gender.
Doppler - Fetal Vessels

 Umbilical Artery
 S/D:   2.45           52  %tile       RI:
 PI:    0.87                           PSV:       45.61   cm/s

Cervix Uterus Adnexa

 Cervix:       Not visualized (advanced GA >76wks)
Impression

 Single living intrauterine pregnancy at 35 weeks 6 days.
 Fetal growth restriction.
 Normal amniotic fluid volume.
 Normal interval fetal anatomy.
 BPP [DATE].
 Normal umbilical artery Dopplers.
Recommendations

 Continue twice weekly NSTs with weekly AFIs
 Delivery at 37 weeks.

                [place]

## 2015-05-29 LAB — OB RESULTS CONSOLE GBS: STREP GROUP B AG: POSITIVE

## 2015-06-15 ENCOUNTER — Inpatient Hospital Stay (HOSPITAL_COMMUNITY)
Admission: AD | Admit: 2015-06-15 | Discharge: 2015-06-17 | DRG: 775 | Disposition: A | Discharge status: Home / Self Care | Payer: Medicaid Other | Source: Ambulatory Visit | Attending: Obstetrics and Gynecology | Admitting: Obstetrics and Gynecology

## 2015-06-15 ENCOUNTER — Encounter (HOSPITAL_COMMUNITY): Payer: Self-pay | Admitting: *Deleted

## 2015-06-15 DIAGNOSIS — Z23 Encounter for immunization: Secondary | ICD-10-CM

## 2015-06-15 DIAGNOSIS — O99824 Streptococcus B carrier state complicating childbirth: Secondary | ICD-10-CM | POA: Diagnosis present

## 2015-06-15 DIAGNOSIS — IMO0001 Reserved for inherently not codable concepts without codable children: Secondary | ICD-10-CM

## 2015-06-15 DIAGNOSIS — Z3A38 38 weeks gestation of pregnancy: Secondary | ICD-10-CM | POA: Diagnosis not present

## 2015-06-15 LAB — CBC
HEMATOCRIT: 32.7 % — AB (ref 36.0–46.0)
HEMOGLOBIN: 11.1 g/dL — AB (ref 12.0–15.0)
MCH: 27.3 pg (ref 26.0–34.0)
MCHC: 33.9 g/dL (ref 30.0–36.0)
MCV: 80.5 fL (ref 78.0–100.0)
Platelets: 290 10*3/uL (ref 150–400)
RBC: 4.06 MIL/uL (ref 3.87–5.11)
RDW: 14.1 % (ref 11.5–15.5)
WBC: 11.1 10*3/uL — ABNORMAL HIGH (ref 4.0–10.5)

## 2015-06-15 LAB — ABO/RH: ABO/RH(D): B POS

## 2015-06-15 LAB — TYPE AND SCREEN
ABO/RH(D): B POS
ANTIBODY SCREEN: NEGATIVE

## 2015-06-15 LAB — RPR: RPR Ser Ql: NONREACTIVE

## 2015-06-15 MED ORDER — OXYCODONE HCL 5 MG PO TABS: 10.0000 mg | ORAL_TABLET | ORAL | Status: DC | PRN

## 2015-06-15 MED ORDER — AMPICILLIN SODIUM 2 G IJ SOLR: 2.0000 g | Freq: Once | INTRAMUSCULAR | Status: DC

## 2015-06-15 MED ORDER — LACTATED RINGERS IV SOLN
INTRAVENOUS | Status: DC
  Administered 2015-06-15: 07:00:00 via INTRAVENOUS

## 2015-06-15 MED ORDER — OXYCODONE HCL 5 MG PO TABS: 5.0000 mg | ORAL_TABLET | ORAL | Status: DC | PRN

## 2015-06-15 MED ORDER — ONDANSETRON HCL 4 MG/2ML IJ SOLN: 4.0000 mg | INTRAMUSCULAR | Status: DC | PRN

## 2015-06-15 MED ORDER — OXYCODONE-ACETAMINOPHEN 5-325 MG PO TABS: 1.0000 | ORAL_TABLET | ORAL | Status: DC | PRN

## 2015-06-15 MED ORDER — OXYTOCIN 40 UNITS IN LACTATED RINGERS INFUSION - SIMPLE MED: 2.5000 [IU]/h | INTRAVENOUS | Status: DC

## 2015-06-15 MED ORDER — METHYLERGONOVINE MALEATE 0.2 MG/ML IJ SOLN: 0.2000 mg | INTRAMUSCULAR | Status: DC | PRN

## 2015-06-15 MED ORDER — ONDANSETRON HCL 4 MG/2ML IJ SOLN: 4.0000 mg | Freq: Four times a day (QID) | INTRAMUSCULAR | Status: DC | PRN

## 2015-06-15 MED ORDER — ACETAMINOPHEN 325 MG PO TABS: 650.0000 mg | ORAL_TABLET | ORAL | Status: DC | PRN

## 2015-06-15 MED ORDER — DIPHENHYDRAMINE HCL 25 MG PO CAPS: 25.0000 mg | ORAL_CAPSULE | Freq: Four times a day (QID) | ORAL | Status: DC | PRN

## 2015-06-15 MED ORDER — SOD CITRATE-CITRIC ACID 500-334 MG/5ML PO SOLN: 30.0000 mL | ORAL | Status: DC | PRN

## 2015-06-15 MED ORDER — LACTATED RINGERS IV SOLN: 500.0000 mL | INTRAVENOUS | Status: DC | PRN

## 2015-06-15 MED ORDER — LIDOCAINE HCL (PF) 1 % IJ SOLN
INTRAMUSCULAR | Status: AC
  Administered 2015-06-15: 30 mL via SUBCUTANEOUS
  Filled 2015-06-15: qty 30

## 2015-06-15 MED ORDER — MEASLES, MUMPS & RUBELLA VAC ~~LOC~~ INJ
0.5000 mL | INJECTION | Freq: Once | SUBCUTANEOUS | Status: DC
  Filled 2015-06-15: qty 0.5

## 2015-06-15 MED ORDER — ONDANSETRON HCL 4 MG PO TABS: 4.0000 mg | ORAL_TABLET | ORAL | Status: DC | PRN

## 2015-06-15 MED ORDER — MAGNESIUM HYDROXIDE 400 MG/5ML PO SUSP: 30.0000 mL | ORAL | Status: DC | PRN

## 2015-06-15 MED ORDER — TETANUS-DIPHTH-ACELL PERTUSSIS 5-2.5-18.5 LF-MCG/0.5 IM SUSP: 0.5000 mL | Freq: Once | INTRAMUSCULAR | Status: DC

## 2015-06-15 MED ORDER — WITCH HAZEL-GLYCERIN EX PADS: 1.0000 "application " | MEDICATED_PAD | CUTANEOUS | Status: DC | PRN

## 2015-06-15 MED ORDER — SENNOSIDES-DOCUSATE SODIUM 8.6-50 MG PO TABS
2.0000 | ORAL_TABLET | ORAL | Status: DC
  Administered 2015-06-16 (×2): 2 via ORAL
  Filled 2015-06-15 (×2): qty 2

## 2015-06-15 MED ORDER — SODIUM CHLORIDE 0.9 % IV SOLN
2.0000 g | Freq: Once | INTRAVENOUS | Status: DC
  Filled 2015-06-15: qty 2000

## 2015-06-15 MED ORDER — BENZOCAINE-MENTHOL 20-0.5 % EX AERO
1.0000 "application " | INHALATION_SPRAY | CUTANEOUS | Status: DC | PRN
  Administered 2015-06-15: 1 via TOPICAL
  Filled 2015-06-15: qty 56

## 2015-06-15 MED ORDER — OXYTOCIN BOLUS FROM INFUSION
500.0000 mL | INTRAVENOUS | Status: DC
  Administered 2015-06-15: 500 mL via INTRAVENOUS

## 2015-06-15 MED ORDER — OXYTOCIN 40 UNITS IN LACTATED RINGERS INFUSION - SIMPLE MED
INTRAVENOUS | Status: AC
  Filled 2015-06-15: qty 1000

## 2015-06-15 MED ORDER — METHYLERGONOVINE MALEATE 0.2 MG PO TABS: 0.2000 mg | ORAL_TABLET | ORAL | Status: DC | PRN

## 2015-06-15 MED ORDER — IBUPROFEN 600 MG PO TABS
600.0000 mg | ORAL_TABLET | Freq: Four times a day (QID) | ORAL | Status: DC
  Administered 2015-06-15 – 2015-06-17 (×8): 600 mg via ORAL
  Filled 2015-06-15 (×8): qty 1

## 2015-06-15 MED ORDER — ZOLPIDEM TARTRATE 5 MG PO TABS: 5.0000 mg | ORAL_TABLET | Freq: Every evening | ORAL | Status: DC | PRN

## 2015-06-15 MED ORDER — SIMETHICONE 80 MG PO CHEW: 80.0000 mg | CHEWABLE_TABLET | ORAL | Status: DC | PRN

## 2015-06-15 MED ORDER — DIBUCAINE 1 % RE OINT: 1.0000 "application " | TOPICAL_OINTMENT | RECTAL | Status: DC | PRN

## 2015-06-15 MED ORDER — PRENATAL MULTIVITAMIN CH
1.0000 | ORAL_TABLET | Freq: Every day | ORAL | Status: DC
  Administered 2015-06-15 – 2015-06-16 (×2): 1 via ORAL
  Filled 2015-06-15 (×2): qty 1

## 2015-06-15 MED ORDER — LIDOCAINE HCL (PF) 1 % IJ SOLN
30.0000 mL | INTRAMUSCULAR | Status: AC | PRN
  Administered 2015-06-15: 30 mL via SUBCUTANEOUS
  Filled 2015-06-15: qty 30

## 2015-06-15 MED ORDER — FLEET ENEMA 7-19 GM/118ML RE ENEM: 1.0000 | ENEMA | RECTAL | Status: DC | PRN

## 2015-06-15 MED ORDER — OXYCODONE-ACETAMINOPHEN 5-325 MG PO TABS: 2.0000 | ORAL_TABLET | ORAL | Status: DC | PRN

## 2015-06-15 MED ORDER — COCONUT OIL OIL: 1.0000 "application " | TOPICAL_OIL | Status: DC | PRN

## 2015-06-15 NOTE — H&P (Signed)
Shelley Bishop is a 25 y.o. female, G2P1001, EGA 38+ weeks with EDC 6-16 presenting for ROM and ctx, came by EMS.  On eval in MAU, VE 9 cm dilated, taken to L&D.  Prenatal care uncomplicated.  Maternal Medical History:  Reason for admission: Rupture of membranes and contractions.   Contractions: Frequency: regular.   Perceived severity is strong.    Fetal activity: Perceived fetal activity is normal.    Prenatal complications: no prenatal complications   OB History    Gravida Para Term Preterm AB TAB SAB Ectopic Multiple Living   2 1 1       1     SVD, IUGR  Past Medical History  Diagnosis Date  . IUGR (intrauterine growth restriction)   . Medical history non-contributory    Past Surgical History  Procedure Laterality Date  . No past surgeries     Family History: family history includes Hypertension in her mother. Social History:  reports that she has never smoked. She has never used smokeless tobacco. She reports that she does not drink alcohol or use illicit drugs.   Prenatal Transfer Tool  Maternal Diabetes: No Genetic Screening: Normal Maternal Ultrasounds/Referrals: Normal Fetal Ultrasounds or other Referrals:  None Maternal Substance Abuse:  No Significant Maternal Medications:  None Significant Maternal Lab Results:  Lab values include: Group B Strep positive Other Comments:  Did not receive abx for GBS-rapid labor  Review of Systems  Respiratory: Negative.   Cardiovascular: Negative.     Dilation: 10 Effacement (%): 100 Exam by:: A. Love Last menstrual period 08/22/2014. Maternal Exam:  Uterine Assessment: Contraction strength is firm.  Contraction frequency is regular.   Abdomen: Patient reports no abdominal tenderness. Estimated fetal weight is 7 lbs.   Fetal presentation: vertex  Introitus: Normal vulva. Normal vagina.  Amniotic fluid character: clear.  Pelvis: adequate for delivery.   Cervix: Cervix evaluated by digital exam.      Physical Exam  Vitals reviewed. Constitutional: She appears well-developed and well-nourished.  Cardiovascular: Normal rate, regular rhythm and normal heart sounds.   No murmur heard. Respiratory: Effort normal and breath sounds normal. No respiratory distress. She has no wheezes.  GI: Soft.    Prenatal labs: ABO, Rh:  B pos Antibody:  neg Rubella:  Immune RPR: Non Reactive (10/20 2120)  HBsAg:   Neg HIV: Non Reactive (10/20 2120)  GBS:   Pos  Assessment/Plan: IUP at 38+ weeks in active labor with advanced dilation, +GBS.  Pt admitted, delivered prior to receiving Ampicillin for GBS, see delivery note.   Shelley Bishop D 06/15/2015, 7:34 AM

## 2015-06-15 NOTE — Lactation Note (Signed)
This note was copied from a baby's chart. Lactation Consultation Note   Taught mother hand expression and colostrum was easily expressible. She was inquiring about use of formula because she wanted to try both. Explained that it was wise to wait 3 weeks if possible and that at that time she might consider placing expressed breast milk in a bottle.  Baby was giving subtle feeding cues so he was placed skin-to-skin on mother. He was not interesting in latching. RN reports rapid delivery and that he has been "spiity"  Explained to mother that it may take a bit longer for him to get hungry. Information given on support groups and outpatient services. Patient Name: Shelley Bishop Traum [id number] Date: 06/15/2015 Reason for consult: Initial assessment   Maternal Data Has patient been taught Hand Expression?: Yes Does the patient have breastfeeding experience prior to this delivery?: Yes  Feeding Feeding Type: Breast Fed Length of feed: 0 min  LATCH Score/Interventions Latch: Too sleepy or reluctant, no latch achieved, no sucking elicited.  Audible Swallowing: None  Type of Nipple: Everted at rest and after stimulation  Comfort (Breast/Nipple): Soft / non-tender     Hold (Positioning): No assistance needed to correctly position infant at breast.  LATCH Score: 6  Lactation Tools Discussed/Used     Consult Status Consult Status: Follow-up Date: 06/16/15 Follow-up type: In-patient    Van Clines 06/15/2015, 2:25 PM

## 2015-06-16 NOTE — Lactation Note (Signed)
This note was copied from a baby's chart. Lactation Consultation Note  Baby is BF well. Mother desires to give the baby formula. She stated that she thought the baby was not getting enough.  Assisted her with a deeper latch and showed her breast compression. Many swallows were heard.  Explained the risks of formula to her.  Offered to help her with hand expression and give her a pump but she is afraid that pumping is going to make her breasts "saggy"  Educated her that pregnancy is responsible for that breast change and that her breasts are just maturing and normal.  FOB came into the room and made they comment that he wanted the baby to eat as much BM as possible. Mom spoke to him in their language and then he thought that they may need formula. Told them that it was only recommended for medical reasons but that we would respect their wishes. Patient Name: Shelley Bishop [id number] Date: 06/16/2015 Reason for consult: Follow-up assessment   Maternal Data    Feeding Feeding Type: Breast Fed  LATCH Score/Interventions Latch: Repeated attempts needed to sustain latch, nipple held in mouth throughout feeding, stimulation needed to elicit sucking reflex. Intervention(s): Assist with latch;Breast compression  Audible Swallowing: A few with stimulation  Type of Nipple: Everted at rest and after stimulation  Comfort (Breast/Nipple): Soft / non-tender     Hold (Positioning): Assistance needed to correctly position infant at breast and maintain latch.  LATCH Score: 7  Lactation Tools Discussed/Used     Consult Status Consult Status: Follow-up Date: 06/17/15 Follow-up type: In-patient    Shelley Bishop 06/16/2015, 3:03 PM

## 2015-06-16 NOTE — Progress Notes (Signed)
UR chart review completed.  

## 2015-06-16 NOTE — Progress Notes (Signed)
Post Partum Day 1 Subjective: no complaints, up ad lib, tolerating PO and nl lochia, pain controlled  Objective: Blood pressure 90/45, pulse 78, temperature 98 F (36.7 C), temperature source Oral, resp. rate 18, height 5\' 1"  (1.549 m), weight 71.668 kg (158 lb), last menstrual period 08/22/2014, unknown if currently breastfeeding.  Physical Exam:  General: alert and no distress Lochia: appropriate Uterine Fundus: firm   Recent Labs  06/15/15 0708  HGB 11.1*  HCT 32.7*    Assessment/Plan: Plan for discharge tomorrow, Breastfeeding and Lactation consult.  Routine Postpartum care.     LOS: 1 day   Bovard-Stuckert, Wendle Kina 06/16/2015, 7:48 AM

## 2015-06-17 MED ORDER — IBUPROFEN 600 MG PO TABS: 600.0000 mg | ORAL_TABLET | Freq: Four times a day (QID) | ORAL | Status: DC

## 2015-06-17 NOTE — Progress Notes (Signed)
Post Partum Day 2 Subjective: no complaints and tolerating PO  Objective: Blood pressure 98/51, pulse 78, temperature 98.6 F (37 C), temperature source Oral, resp. rate 18, height 5\' 1"  (1.549 m), weight 158 lb (71.668 kg), last menstrual period 08/22/2014, unknown if currently breastfeeding.  Physical Exam:  General: alert and cooperative Lochia: appropriate Uterine Fundus: firm    Recent Labs  06/15/15 0708  HGB 11.1*  HCT 32.7*    Assessment/Plan: Discharge home   LOS: 2 days   Lashaundra Lehrmann W 06/17/2015, 8:58 AM

## 2015-06-17 NOTE — Discharge Summary (Signed)
OB Discharge Summary     Patient Name: Shelley Bishop DOB: Apr 13, 1990 MRN: [id number]  Date of admission: 06/15/2015 Delivering MD: Lauretta Chester NILES   Date of discharge: 06/17/2015  Admitting diagnosis: 58WKS CONTRACTIONS  Intrauterine pregnancy: [redacted]w[redacted]d     Secondary diagnosis:  Active Problems:   Active labor  Additional problems: none     Discharge diagnosis: Term Pregnancy Delivered                                                                                                Post partum procedures:none  Augmentation: none  Complications: None  Hospital course:  Onset of Labor With Vaginal Delivery     25 y.o. yo G2P2002 at [redacted]w[redacted]d was admitted in Active Labor on 06/15/2015. Patient had an uncomplicated labor course as follows:  Membrane Rupture Time/Date: 6:00 AM ,06/15/2015   Intrapartum Procedures: Episiotomy: None [1]                                         Lacerations:  1st degree [2]  Patient had a delivery of a Viable infant. 06/15/2015  Information for the patient's newborn:  Anamari, [place]ui [id number]  Delivery Method: Vaginal, Spontaneous Delivery (Filed from Delivery Summary)    Pateint had an uncomplicated postpartum course.  She is ambulating, tolerating a regular diet, passing flatus, and urinating well. Patient is discharged home in stable condition on 06/17/2015.    Physical exam  Filed Vitals:   06/15/15 1820 06/16/15 0530 06/16/15 1836 06/17/15 0541  BP: 120/68 90/45 107/80 98/51  Pulse: 79 78 86 78  Temp: 98.8 F (37.1 C) 98 F (36.7 C) 98.2 F (36.8 C) 98.6 F (37 C)  TempSrc: Oral Oral Oral Oral  Resp: 18 18 18 18   Height:      Weight:       General: alert and cooperative Lochia: appropriate Uterine Fundus: firm  Labs: Lab Results  Component Value Date   WBC 11.1* 06/15/2015   HGB 11.1* 06/15/2015   HCT 32.7* 06/15/2015   MCV 80.5 06/15/2015   PLT 290 06/15/2015   CMP Latest Ref Rng 11/29/2014  Glucose 65 - 99  mg/dL 91  BUN 6 - 20 mg/dL 6  Creatinine 0.44 - 1.00 mg/dL 0.53  Sodium 135 - 145 mmol/L 137  Potassium 3.5 - 5.1 mmol/L 3.5  Chloride 101 - 111 mmol/L 104  CO2 22 - 32 mmol/L 22  Calcium 8.9 - 10.3 mg/dL 10.0  Total Protein 6.5 - 8.1 g/dL 7.9  Total Bilirubin 0.3 - 1.2 mg/dL 0.8  Alkaline Phos 38 - 126 U/L 54  AST 15 - 41 U/L 17  ALT 14 - 54 U/L 11(L)    Discharge instruction: per After Visit Summary and "Baby and Me Booklet".  After visit meds:    Medication List    TAKE these medications        ibuprofen 600 MG tablet  Commonly known as:  ADVIL,MOTRIN  Take 1 tablet (600 mg  total) by mouth every 6 (six) hours.        Diet: routine diet  Activity: Advance as tolerated. Pelvic rest for 6 weeks.   Outpatient follow up:6 weeks Follow up Appt:No future appointments. Follow up Visit:No Follow-up on file.  Postpartum contraception: Undecided  Newborn Data: Live born female  Birth Weight: 6 lb 8.8 oz (2970 g) APGAR: 9, 9  Baby Feeding: Breast Disposition:home with mother   06/17/2015 Logan Bores, MD

## 2015-06-17 NOTE — Lactation Note (Signed)
This note was copied from a baby's chart. Lactation Consultation Note  Patient Name: Shelley Bishop [id number] Date: 06/17/2015 Reason for consult: Follow-up assessment;Other (Comment) (6% weight loss )  Baby is 50 hours old. Mom and baby ready for D/C. Baby awake and rooting .  Mom needed assistance with latch for depth . Multiply swallows noted, increased with breast compressions.  Baby fed 10 mins, and released on his own.  Mom denies soreness. Sore nipple and engorgement prevention and tx reviewed.  LC instructed mom on the use of hand pump and #24 Flange gives mom plenty of room when testing the hand pump.  Referred to the baby and me booklet pages 24 -25.  Mother informed of post-discharge support and given phone number to the lactation department, including services for phone  call assistance; out-patient appointments; and breastfeeding support group. List of other breastfeeding resources in the community  given in the handout. Encouraged mother to call for problems or concerns related to breastfeeding.   Maternal Data Has patient been taught Hand Expression?: Yes  Feeding Feeding Type: Breast Fed Length of feed:  (multiply swallows noted )  LATCH Score/Interventions Latch: Grasps breast easily, tongue down, lips flanged, rhythmical sucking. Intervention(s): Skin to skin;Teach feeding cues;Waking techniques Intervention(s): Assist with latch;Adjust position;Breast massage;Breast compression  Audible Swallowing: Spontaneous and intermittent  Type of Nipple: Everted at rest and after stimulation  Comfort (Breast/Nipple): Filling, red/small blisters or bruises, mild/mod discomfort     Hold (Positioning): Assistance needed to correctly position infant at breast and maintain latch. Intervention(s): Breastfeeding basics reviewed;Support Pillows;Position options;Skin to skin  LATCH Score: 8  Lactation Tools Discussed/Used Tools: Pump (#24 Flange fits well ) Breast  pump type: Manual WIC Program: Yes Pump Review: Setup, frequency, and cleaning Initiated by:: MAI  Date initiated:: 06/17/15   Consult Status Consult Status: Complete Date: 06/17/15    Myer Haff 06/17/2015, 9:44 AM

## 2017-10-14 ENCOUNTER — Ambulatory Visit (HOSPITAL_COMMUNITY)
Admission: EM | Admit: 2017-10-14 | Discharge: 2017-10-14 | Disposition: A | Discharge status: Home / Self Care | Payer: Medicaid Other | Attending: Family Medicine | Admitting: Family Medicine

## 2017-10-14 ENCOUNTER — Encounter: Payer: Self-pay | Admitting: Emergency Medicine

## 2017-10-14 DIAGNOSIS — Z3491 Encounter for supervision of normal pregnancy, unspecified, first trimester: Secondary | ICD-10-CM

## 2017-10-14 DIAGNOSIS — R110 Nausea: Secondary | ICD-10-CM | POA: Diagnosis not present

## 2017-10-14 DIAGNOSIS — N644 Mastodynia: Secondary | ICD-10-CM | POA: Diagnosis not present

## 2017-10-14 DIAGNOSIS — Z3201 Encounter for pregnancy test, result positive: Secondary | ICD-10-CM | POA: Diagnosis not present

## 2017-10-14 LAB — POCT PREGNANCY, URINE: Preg Test, Ur: POSITIVE — AB

## 2017-10-14 MED ORDER — ONDANSETRON 8 MG PO TBDP: 8.0000 mg | ORAL_TABLET | Freq: Three times a day (TID) | ORAL | 6 refills | Status: DC | PRN

## 2017-10-14 MED ORDER — PRENATAL COMPLETE 14-0.4 MG PO TABS: 1.0000 | ORAL_TABLET | Freq: Every day | ORAL | 4 refills | Status: DC

## 2017-10-14 NOTE — ED Provider Notes (Signed)
[facility]    CSN: [id number] Arrival date & time: 10/14/17  1144     History   Chief Complaint Chief Complaint  Patient presents with  . Possible Pregnancy    HPI Shelley Bishop is a 26 y.o. female.   Pt presents with complaints of positive pregnancy test at home. Reports needing doctors proof to get on medicare. Denies any pain or bleeding.  Patient admits to a certain amount of nausea every day.  She has had breast tenderness as well.     Past Medical History:  Diagnosis Date  . IUGR (intrauterine growth restriction)   . Medical history non-contributory     Patient Active Problem List   Diagnosis Date Noted  . Active labor 06/15/2015  . Supervision of high-risk pregnancy 11/20/2012  . Symmetric IUGR complicating pregnancy, antepartum 11/20/2012    Past Surgical History:  Procedure Laterality Date  . NO PAST SURGERIES      OB History    Gravida  3   Para  2   Term  2   Preterm      AB      Living  2     SAB      TAB      Ectopic      Multiple  0   Live Births  2            Home Medications    Prior to Admission medications   Medication Sig Start Date End Date Taking? Authorizing Provider  ondansetron (ZOFRAN-ODT) 8 MG disintegrating tablet Take 1 tablet (8 mg total) by mouth every 8 (eight) hours as needed for nausea. 10/14/17   Robyn Haber, MD  Prenatal Vit-Fe Fumarate-FA (PRENATAL COMPLETE) 14-0.4 MG TABS Take 1 tablet by mouth daily. 10/14/17   Robyn Haber, MD    Family History Family History  Problem Relation Age of Onset  . Hypertension Mother     Social History Social History   Tobacco Use  . Smoking status: Never Smoker  . Smokeless tobacco: Never Used  Substance Use Topics  . Alcohol use: No    Alcohol/week: 0.0 standard drinks  . Drug use: No     Allergies   Patient has no known allergies.   Review of Systems Review of Systems   Physical Exam Triage Vital Signs ED Triage  Vitals  Enc Vitals Group     BP 10/14/17 1200 113/63     Pulse Rate 10/14/17 1200 87     Resp 10/14/17 1200 18     Temp 10/14/17 1200 98.3 F (36.8 C)     Temp src --      SpO2 10/14/17 1200 97 %     Weight --      Height --      Head Circumference --      Peak Flow --      Pain Score 10/14/17 1159 0     Pain Loc --      Pain Edu? --      Excl. in [place]? --    No data found.  Updated Vital Signs BP 113/63   Pulse 87   Temp 98.3 F (36.8 C)   Resp 18   LMP 08/27/2017 (Exact Date)   SpO2 97%    Physical Exam  Constitutional: She is oriented to person, place, and time. She appears well-developed and well-nourished.  HENT:  Right Ear: External ear normal.  Left Ear: External ear normal.  Mouth/Throat: Oropharynx is  clear and moist.  Eyes: Pupils are equal, round, and reactive to light. Conjunctivae are normal.  Neck: Normal range of motion. Neck supple.  Pulmonary/Chest: Effort normal.  Musculoskeletal: Normal range of motion.  Neurological: She is alert and oriented to person, place, and time.  Skin: Skin is warm and dry.  Nursing note and vitals reviewed.    UC Treatments / Results  Labs (all labs ordered are listed, but only abnormal results are displayed) Labs Reviewed  POCT PREGNANCY, URINE - Abnormal; Notable for the following components:      Result Value   Preg Test, Ur POSITIVE (*)    All other components within normal limits    EKG None  Radiology No results found.  Procedures Procedures (including critical care time)  Medications Ordered in UC Medications - No data to display  Initial Impression / Assessment and Plan / UC Course  I have reviewed the triage vital signs and the nursing notes.  Pertinent labs & imaging results that were available during my care of the patient were reviewed by me and considered in my medical decision making (see chart for details).    Final Clinical Impressions(s) / UC Diagnoses   Final diagnoses:  First  trimester pregnancy  Nausea without vomiting   Discharge Instructions   None    ED Prescriptions    Medication Sig Dispense Auth. Provider   Prenatal Vit-Fe Fumarate-FA (PRENATAL COMPLETE) 14-0.4 MG TABS Take 1 tablet by mouth daily. 60 each Robyn Haber, MD   ondansetron (ZOFRAN-ODT) 8 MG disintegrating tablet Take 1 tablet (8 mg total) by mouth every 8 (eight) hours as needed for nausea. 12 tablet Robyn Haber, MD     Controlled Substance Prescriptions [place] Controlled Substance Registry consulted? Not Applicable   Robyn Haber, MD 10/14/17 1233

## 2017-10-14 NOTE — Discharge Instructions (Addendum)
Your pregnancy test is positive today.  Please follow-up with your obstetrician.

## 2017-10-14 NOTE — ED Triage Notes (Addendum)
Pt presents with complaints of positive pregnancy test at home. Reports needing doctors proof to get on medicare. Denies any pain or bleeding.

## 2017-10-24 ENCOUNTER — Inpatient Hospital Stay (HOSPITAL_COMMUNITY)
Admission: AD | Admit: 2017-10-24 | Discharge: 2017-10-24 | Disposition: A | Discharge status: Home / Self Care | Payer: Medicaid Other | Source: Ambulatory Visit | Attending: Obstetrics and Gynecology | Admitting: Obstetrics and Gynecology

## 2017-10-24 ENCOUNTER — Inpatient Hospital Stay (HOSPITAL_COMMUNITY): Payer: Medicaid Other

## 2017-10-24 ENCOUNTER — Encounter (HOSPITAL_COMMUNITY): Payer: Self-pay | Admitting: *Deleted

## 2017-10-24 ENCOUNTER — Other Ambulatory Visit: Payer: Self-pay

## 2017-10-24 DIAGNOSIS — R109 Unspecified abdominal pain: Secondary | ICD-10-CM | POA: Insufficient documentation

## 2017-10-24 DIAGNOSIS — Z3A08 8 weeks gestation of pregnancy: Secondary | ICD-10-CM | POA: Insufficient documentation

## 2017-10-24 DIAGNOSIS — O219 Vomiting of pregnancy, unspecified: Secondary | ICD-10-CM | POA: Diagnosis not present

## 2017-10-24 DIAGNOSIS — O26891 Other specified pregnancy related conditions, first trimester: Secondary | ICD-10-CM | POA: Diagnosis not present

## 2017-10-24 DIAGNOSIS — O210 Mild hyperemesis gravidarum: Secondary | ICD-10-CM | POA: Diagnosis not present

## 2017-10-24 DIAGNOSIS — R42 Dizziness and giddiness: Secondary | ICD-10-CM | POA: Diagnosis present

## 2017-10-24 LAB — URINALYSIS, ROUTINE W REFLEX MICROSCOPIC
Bilirubin Urine: NEGATIVE
Glucose, UA: NEGATIVE mg/dL
Hgb urine dipstick: NEGATIVE
Ketones, ur: NEGATIVE mg/dL
Leukocytes, UA: NEGATIVE
Nitrite: NEGATIVE
Protein, ur: NEGATIVE mg/dL
Specific Gravity, Urine: 1.023 (ref 1.005–1.030)
pH: 6 (ref 5.0–8.0)

## 2017-10-24 LAB — CBC
HCT: 35 % — ABNORMAL LOW (ref 36.0–46.0)
Hemoglobin: 11.8 g/dL — ABNORMAL LOW (ref 12.0–15.0)
MCH: 27.4 pg (ref 26.0–34.0)
MCHC: 33.7 g/dL (ref 30.0–36.0)
MCV: 81.2 fL (ref 80.0–100.0)
Platelets: 315 10*3/uL (ref 150–400)
RBC: 4.31 MIL/uL (ref 3.87–5.11)
RDW: 13.2 % (ref 11.5–15.5)
WBC: 7.6 10*3/uL (ref 4.0–10.5)
nRBC: 0 % (ref 0.0–0.2)

## 2017-10-24 LAB — COMPREHENSIVE METABOLIC PANEL
ALT: 11 U/L (ref 0–44)
AST: 13 U/L — AB (ref 15–41)
Albumin: 3.7 g/dL (ref 3.5–5.0)
Alkaline Phosphatase: 61 U/L (ref 38–126)
Anion gap: 9 (ref 5–15)
CHLORIDE: 100 mmol/L (ref 98–111)
CO2: 26 mmol/L (ref 22–32)
Calcium: 9.3 mg/dL (ref 8.9–10.3)
Creatinine, Ser: 0.58 mg/dL (ref 0.44–1.00)
GFR calc Af Amer: >60 mL/min (ref >60)
Glucose, Bld: 109 mg/dL — ABNORMAL HIGH (ref 70–99)
POTASSIUM: 3.9 mmol/L (ref 3.5–5.1)
Sodium: 135 mmol/L (ref 135–145)
Total Bilirubin: 0.7 mg/dL (ref 0.3–1.2)
Total Protein: 7.6 g/dL (ref 6.5–8.1)

## 2017-10-24 LAB — HCG, QUANTITATIVE, PREGNANCY: hCG, Beta Chain, Quant, S: 114370 m[IU]/mL — ABNORMAL HIGH (ref <5)

## 2017-10-24 MED ORDER — HYDROMORPHONE HCL 1 MG/ML IJ SOLN: 1.0000 mg | Freq: Once | INTRAMUSCULAR | Status: DC

## 2017-10-24 MED ORDER — PROMETHAZINE HCL 25 MG PO TABS
25.0000 mg | ORAL_TABLET | Freq: Once | ORAL | Status: AC
  Administered 2017-10-24: 25 mg via ORAL
  Filled 2017-10-24: qty 1

## 2017-10-24 MED ORDER — PROMETHAZINE HCL 25 MG PO TABS: 25.0000 mg | ORAL_TABLET | Freq: Once | ORAL | Status: DC

## 2017-10-24 NOTE — MAU Provider Note (Signed)
History     CSN: [id number]  Arrival date and time: 10/24/17 1544   None     Chief Complaint  Patient presents with  . Dizziness  . Nausea   HPI Shelley Bishop is 27 y.o. G3P2002 [redacted]w[redacted]d weeks presenting with nausea, dizziness.  Has intermittent abdominal pain, states she is not having pain at this time.  Had + pregnancy test 3 weeks ago.  Reports having severe N&V with previous pregnancy.  Neg for vaginal bleeding.   Was seen at Urgent Care 10/4 and given Rx for Zofran, husband is picking up today.  She plans to see Dr. Terri Bishop for her prenatal care, waiting on insurance approval.     Past Medical History:  Diagnosis Date  . IUGR (intrauterine growth restriction)   . Medical history non-contributory     Past Surgical History:  Procedure Laterality Date  . NO PAST SURGERIES      Family History  Problem Relation Age of Onset  . Hypertension Mother     Social History   Tobacco Use  . Smoking status: Never Smoker  . Smokeless tobacco: Never Used  Substance Use Topics  . Alcohol use: No    Alcohol/week: 0.0 standard drinks  . Drug use: No    Allergies: No Known Allergies  Medications Prior to Admission  Medication Sig Dispense Refill Last Dose  . ondansetron (ZOFRAN-ODT) 8 MG disintegrating tablet Take 1 tablet (8 mg total) by mouth every 8 (eight) hours as needed for nausea. 12 tablet 6   . Prenatal Vit-Fe Fumarate-FA (PRENATAL COMPLETE) 14-0.4 MG TABS Take 1 tablet by mouth daily. 60 each 4     Review of Systems  Constitutional: Positive for appetite change. Negative for fever.  Respiratory: Negative for shortness of breath.   Gastrointestinal: Positive for abdominal pain (intermittent, none at this time.), nausea and vomiting.  Genitourinary: Negative for dysuria, vaginal bleeding and vaginal discharge.  Neurological: Negative for headaches.   Physical Exam   Blood pressure 108/68, pulse 87, temperature 98 F (36.7 C), resp. rate 16, height 5\' 4"  (1.626  m), weight 70.3 kg, last menstrual period 08/27/2017, SpO2 100 %, unknown if currently breastfeeding.  Physical Exam  Nursing note and vitals reviewed. Constitutional: She is oriented to person, place, and time. She appears well-developed and well-nourished. No distress.  HENT:  Head: Normocephalic.  Neck: Normal range of motion.  Cardiovascular: Normal rate.  Respiratory: Effort normal.  GI: Soft. She exhibits no distension and no mass. There is no tenderness. There is no rebound and no guarding.  Genitourinary:  Genitourinary Comments: Neg for gyn sxs, vaginal bleeding or pain.  Exam deferred  Neurological: She is alert and oriented to person, place, and time.  Skin: Skin is warm and dry.  Psychiatric: She has a normal mood and affect. Her behavior is normal. Thought content normal.    Results for orders placed or performed during the hospital encounter of 10/24/17 (from the past 24 hour(s))  CBC     Status: Abnormal   Collection Time: 10/24/17  4:59 PM  Result Value Ref Range   WBC 7.6 4.0 - 10.5 K/uL   RBC 4.31 3.87 - 5.11 MIL/uL   Hemoglobin 11.8 (L) 12.0 - 15.0 g/dL   HCT 35.0 (L) 36.0 - 46.0 %   MCV 81.2 80.0 - 100.0 fL   MCH 27.4 26.0 - 34.0 pg   MCHC 33.7 30.0 - 36.0 g/dL   RDW 13.2 11.5 - 15.5 %   Platelets 315  150 - 400 K/uL   nRBC 0.0 0.0 - 0.2 %  hCG, quantitative, pregnancy     Status: Abnormal   Collection Time: 10/24/17  4:59 PM  Result Value Ref Range   hCG, Beta Chain, Quant, S 114,370 (H) <5 mIU/mL  Comprehensive metabolic panel     Status: Abnormal   Collection Time: 10/24/17  4:59 PM  Result Value Ref Range   Sodium 135 135 - 145 mmol/L   Potassium 3.9 3.5 - 5.1 mmol/L   Chloride 100 98 - 111 mmol/L   CO2 26 22 - 32 mmol/L   Glucose, Bld 109 (H) 70 - 99 mg/dL   BUN <5 (L) 6 - 20 mg/dL   Creatinine, Ser 0.58 0.44 - 1.00 mg/dL   Calcium 9.3 8.9 - 10.3 mg/dL   Total Protein 7.6 6.5 - 8.1 g/dL   Albumin 3.7 3.5 - 5.0 g/dL   AST 13 (L) 15 - 41 U/L    ALT 11 0 - 44 U/L   Alkaline Phosphatase 61 38 - 126 U/L   Total Bilirubin 0.7 0.3 - 1.2 mg/dL   GFR calc non Af Amer >60 >60 mL/min   GFR calc Af Amer >60 >60 mL/min   Anion gap 9 5 - 15  Urinalysis, Routine w reflex microscopic     Status: Abnormal   Collection Time: 10/24/17  5:02 PM  Result Value Ref Range   Color, Urine YELLOW YELLOW   APPearance CLOUDY (A) CLEAR   Specific Gravity, Urine 1.023 1.005 - 1.030   pH 6.0 5.0 - 8.0   Glucose, UA NEGATIVE NEGATIVE mg/dL   Hgb urine dipstick NEGATIVE NEGATIVE   Bilirubin Urine NEGATIVE NEGATIVE   Ketones, ur NEGATIVE NEGATIVE mg/dL   Protein, ur NEGATIVE NEGATIVE mg/dL   Nitrite NEGATIVE NEGATIVE   Leukocytes, UA NEGATIVE NEGATIVE    US Ob Comp Less 14 Wks  Result Date: 10/24/2017 CLINICAL DATA:  Early pregnancy.  Abdominal pain. EXAM: OBSTETRIC <14 WK ULTRASOUND TECHNIQUE: Transabdominal ultrasound was performed for evaluation of the gestation as well as the maternal uterus and adnexal regions. COMPARISON:  10/31/2014 FINDINGS: Intrauterine gestational sac: Single and within normal limits. Yolk sac:  Present Embryo:  Present Cardiac Activity: Present Heart Rate: 142 bpm CRL:   8.54 mm   6 w 5 d                  [place] EDC: 06/14/2018 Subchorionic hemorrhage:  None visualized. Maternal uterus/adnexae: Normal appearance of the ovaries. Patient refused transvaginal scanning. IMPRESSION: Normal appearing living single intrauterine pregnancy at 6 weeks 5 days by crown-rump length. Electronically Signed   By: Shelley Bishop M.D.   On: 10/24/2017 18:00   MAU Course  Procedures  MDM MSE Exam Labs U/S Medication-Phenergan 25mg  PO given in MAU. 19:45  Discussed labs, U/S with Shelley Bishop.   She is feeling much better after taking Phenergan.  Ready for discharge  Assessment and Plan  A:  Abdominal pain in first trimester pregnancy      Nausea and vomiting       Dizziness      Viable IUP measuring [redacted]w[redacted]d gestation       P:  Patient has medication  for N&V that is being picked up tonight      Continue eating small amounts of bland food often      Importance of staying well hydrated discussed      Begin prenatal care with Dr. Terri Bishop.   Shelley Bishop 10/24/2017,  7:41 PM

## 2017-10-24 NOTE — Discharge Instructions (Signed)
Abdominal Pain During Pregnancy Abdominal pain is common in pregnancy. Most of the time, it does not cause harm. There are many causes of abdominal pain. Some causes are more serious than others and sometimes the cause is not known. Abdominal pain can be a sign that something is very wrong with the pregnancy or the pain may have nothing to do with the pregnancy. Always tell your health care provider if you have any abdominal pain. Follow these instructions at home:  Do not have sex or put anything in your vagina until your symptoms go away completely.  Watch your abdominal pain for any changes.  Get plenty of rest until your pain improves.  Drink enough fluid to keep your urine clear or pale yellow.  Take over-the-counter or prescription medicines only as told by your health care provider.  Keep all follow-up visits as told by your health care provider. This is important. Contact a health care provider if:  You have a fever.  Your pain gets worse or you have cramping.  Your pain continues after resting. Get help right away if:  You are bleeding, leaking fluid, or passing tissue from the vagina.  You have vomiting or diarrhea that does not go away.  You have painful or bloody urination.  You notice a decrease in your baby's movements.  You feel very weak or faint.  You have shortness of breath.  You develop a severe headache with abdominal pain.  You have abnormal vaginal discharge with abdominal pain. This information is not intended to replace advice given to you by your health care provider. Make sure you discuss any questions you have with your health care provider. Document Released: 12/28/2004 Document Revised: 10/09/2015 Document Reviewed: 07/27/2012 Elsevier Interactive Patient Education  2018 [employer].  Hyperemesis Gravidarum Hyperemesis gravidarum is a severe form of nausea and vomiting that happens during pregnancy. Hyperemesis is worse than morning  sickness. It may cause you to have nausea or vomiting all day for many days. It may keep you from eating and drinking enough food and liquids. Hyperemesis usually occurs during the first half (the first 20 weeks) of pregnancy. It often goes away once a woman is in her second half of pregnancy. However, sometimes hyperemesis continues through an entire pregnancy. What are the causes? The cause of this condition is not known. It may be related to changes in chemicals (hormones) in the body during pregnancy, such as the high level of pregnancy hormone (human chorionic gonadotropin) or the increase in the female sex hormone (estrogen). What are the signs or symptoms? Symptoms of this condition include:  Severe nausea and vomiting.  Nausea that does not go away.  Vomiting that does not allow you to keep any food down.  Weight loss.  Body fluid loss (dehydration).  Having no desire to eat, or not liking food that you have previously enjoyed.  How is this diagnosed? This condition may be diagnosed based on:  A physical exam.  Your medical history.  Your symptoms.  Blood tests.  Urine tests.  How is this treated? This condition may be managed with medicine. If medicines to do not help relieve nausea and vomiting, you may need to receive fluids through an IV tube at the hospital. Follow these instructions at home:  Take over-the-counter and prescription medicines only as told by your health care provider.  Avoid iron pills and multivitamins that contain iron for the first 3-4 months of pregnancy. If you take prescription iron pills, do not  stop taking them unless your health care provider approves.  Take the following actions to help prevent nausea and vomiting: ? In the morning, before getting out of bed, try eating a couple of dry crackers or a piece of toast. ? Avoid foods and smells that upset your stomach. Fatty and spicy foods may make nausea worse. ? Eat 5-6 small meals a  day. ? Do not drink fluids while eating meals. Drink between meals. ? Eat or suck on things that have ginger in them. Ginger can help relieve nausea. ? Avoid food preparation. The smell of food can spoil your appetite or trigger nausea.  Follow instructions from your health care provider about eating or drinking restrictions.  For snacks, eat high-protein foods, such as cheese.  Keep all follow-up and pre-birth (prenatal) visits as told by your health care provider. This is important. Contact a health care provider if:  You have pain in your abdomen.  You have a severe headache.  You have vision problems.  You are losing weight. Get help right away if:  You cannot drink fluids without vomiting.  You vomit blood.  You have constant nausea and vomiting.  You are very weak.  You are very thirsty.  You feel dizzy.  You faint.  You have a fever or other symptoms that last for more than 2-3 days.  You have a fever and your symptoms suddenly get worse. Summary  Hyperemesis gravidarum is a severe form of nausea and vomiting that happens during pregnancy.  Making some changes to your eating habits may help relieve nausea and vomiting.  This condition may be managed with medicine.  If medicines to do not help relieve nausea and vomiting, you may need to receive fluids through an IV tube at the hospital. This information is not intended to replace advice given to you by your health care provider. Make sure you discuss any questions you have with your health care provider. Document Released: 12/28/2004 Document Revised: 08/27/2015 Document Reviewed: 08/27/2015 Elsevier Interactive Patient Education  2017 [employer].

## 2017-10-24 NOTE — MAU Note (Signed)
Pt presents to MAU with complaints of nausea, dizziness and lower abdominal cramping. + pregnancy test last week

## 2017-11-30 ENCOUNTER — Encounter: Payer: Self-pay | Admitting: Family Medicine

## 2017-12-06 LAB — OB RESULTS CONSOLE RUBELLA ANTIBODY, IGM: Rubella: IMMUNE

## 2017-12-06 LAB — OB RESULTS CONSOLE GC/CHLAMYDIA
Chlamydia: NEGATIVE
Gonorrhea: NEGATIVE

## 2017-12-06 LAB — OB RESULTS CONSOLE HIV ANTIBODY (ROUTINE TESTING): HIV: NONREACTIVE

## 2017-12-06 LAB — OB RESULTS CONSOLE HEPATITIS B SURFACE ANTIGEN: Hepatitis B Surface Ag: NEGATIVE

## 2017-12-06 LAB — OB RESULTS CONSOLE RPR: RPR: NONREACTIVE

## 2018-01-11 DIAGNOSIS — D649 Anemia, unspecified: Secondary | ICD-10-CM

## 2018-01-11 HISTORY — DX: Anemia, unspecified: D64.9

## 2018-01-11 NOTE — L&D Delivery Note (Signed)
Delivery Note  Pt progressed quickly to 9.5cm and involuntarily pushing. AROM done - copious clear fluid noted. Pt pushed 3-4 times and at 10:10 AM a viable female was delivered via Vaginal, Spontaneous (Presentation: OA;  ).  APGAR:9,9 , ; weight pending .   Placenta status:delivered, intact, shultz , .  Cord: 3vc with the following complications: none.  Cord pH: n/a  Anesthesia:  none Episiotomy: None Lacerations:  none Est. Blood Loss (mL):  50  Mom to postpartum.  Baby to Couplet care / Skin to Skin.  Isaiah Serge 06/07/2018, 10:24 AM

## 2018-04-05 ENCOUNTER — Encounter: Payer: BLUE CROSS/BLUE SHIELD | Attending: Obstetrics and Gynecology | Admitting: Registered"

## 2018-04-05 ENCOUNTER — Other Ambulatory Visit: Payer: Self-pay

## 2018-04-05 ENCOUNTER — Encounter: Payer: Self-pay | Admitting: Registered"

## 2018-04-05 DIAGNOSIS — O99810 Abnormal glucose complicating pregnancy: Secondary | ICD-10-CM | POA: Diagnosis not present

## 2018-04-05 DIAGNOSIS — Z8632 Personal history of gestational diabetes: Secondary | ICD-10-CM | POA: Insufficient documentation

## 2018-04-05 NOTE — Progress Notes (Signed)
I have reviewed this chart and agree with the RN/CMA assessment and management.    Caden Fatica C Amaura Authier, MD, FACOG Attending Physician, [facility]  

## 2018-04-05 NOTE — Progress Notes (Signed)

## 2018-05-24 LAB — OB RESULTS CONSOLE GBS: GBS: POSITIVE

## 2018-06-07 ENCOUNTER — Inpatient Hospital Stay (HOSPITAL_COMMUNITY)
Admission: AD | Admit: 2018-06-07 | Discharge: 2018-06-09 | DRG: 807 | Disposition: A | Discharge status: Home / Self Care | Payer: BLUE CROSS/BLUE SHIELD | Attending: Obstetrics and Gynecology | Admitting: Obstetrics and Gynecology

## 2018-06-07 ENCOUNTER — Other Ambulatory Visit: Payer: Self-pay

## 2018-06-07 ENCOUNTER — Encounter (HOSPITAL_COMMUNITY): Payer: Self-pay | Admitting: *Deleted

## 2018-06-07 DIAGNOSIS — O26893 Other specified pregnancy related conditions, third trimester: Secondary | ICD-10-CM | POA: Diagnosis present

## 2018-06-07 DIAGNOSIS — O24420 Gestational diabetes mellitus in childbirth, diet controlled: Principal | ICD-10-CM | POA: Diagnosis present

## 2018-06-07 DIAGNOSIS — Z1159 Encounter for screening for other viral diseases: Secondary | ICD-10-CM | POA: Diagnosis not present

## 2018-06-07 DIAGNOSIS — Z3A39 39 weeks gestation of pregnancy: Secondary | ICD-10-CM | POA: Diagnosis not present

## 2018-06-07 DIAGNOSIS — Z8759 Personal history of other complications of pregnancy, childbirth and the puerperium: Secondary | ICD-10-CM

## 2018-06-07 DIAGNOSIS — O99824 Streptococcus B carrier state complicating childbirth: Secondary | ICD-10-CM | POA: Diagnosis present

## 2018-06-07 LAB — CBC
HCT: 37.3 % (ref 36.0–46.0)
Hemoglobin: 12 g/dL (ref 12.0–15.0)
MCH: 27.5 pg (ref 26.0–34.0)
MCHC: 32.2 g/dL (ref 30.0–36.0)
MCV: 85.6 fL (ref 80.0–100.0)
Platelets: 230 10*3/uL (ref 150–400)
RBC: 4.36 MIL/uL (ref 3.87–5.11)
RDW: 14.5 % (ref 11.5–15.5)
WBC: 8.5 10*3/uL (ref 4.0–10.5)
nRBC: 0 % (ref 0.0–0.2)

## 2018-06-07 LAB — TYPE AND SCREEN
ABO/RH(D): B POS
Antibody Screen: NEGATIVE

## 2018-06-07 LAB — SARS CORONAVIRUS 2 BY RT PCR (HOSPITAL ORDER, PERFORMED IN ~~LOC~~ HOSPITAL LAB): SARS Coronavirus 2: NEGATIVE

## 2018-06-07 MED ORDER — ONDANSETRON HCL 4 MG/2ML IJ SOLN: 4.0000 mg | Freq: Four times a day (QID) | INTRAMUSCULAR | Status: DC | PRN

## 2018-06-07 MED ORDER — OXYCODONE-ACETAMINOPHEN 5-325 MG PO TABS: 1.0000 | ORAL_TABLET | ORAL | Status: DC | PRN

## 2018-06-07 MED ORDER — DIBUCAINE (PERIANAL) 1 % EX OINT: 1.0000 "application " | TOPICAL_OINTMENT | CUTANEOUS | Status: DC | PRN

## 2018-06-07 MED ORDER — ONDANSETRON HCL 4 MG PO TABS: 4.0000 mg | ORAL_TABLET | ORAL | Status: DC | PRN

## 2018-06-07 MED ORDER — BENZOCAINE-MENTHOL 20-0.5 % EX AERO: 1.0000 "application " | INHALATION_SPRAY | CUTANEOUS | Status: DC | PRN

## 2018-06-07 MED ORDER — ZOLPIDEM TARTRATE 5 MG PO TABS: 5.0000 mg | ORAL_TABLET | Freq: Every evening | ORAL | Status: DC | PRN

## 2018-06-07 MED ORDER — ONDANSETRON HCL 4 MG/2ML IJ SOLN: 4.0000 mg | INTRAMUSCULAR | Status: DC | PRN

## 2018-06-07 MED ORDER — BUTORPHANOL TARTRATE 1 MG/ML IJ SOLN: 1.0000 mg | INTRAMUSCULAR | Status: DC | PRN

## 2018-06-07 MED ORDER — LACTATED RINGERS IV SOLN: INTRAVENOUS | Status: DC

## 2018-06-07 MED ORDER — OXYTOCIN BOLUS FROM INFUSION
500.0000 mL | Freq: Once | INTRAVENOUS | Status: AC
  Administered 2018-06-07: 500 mL via INTRAVENOUS

## 2018-06-07 MED ORDER — LACTATED RINGERS IV SOLN: 500.0000 mL | INTRAVENOUS | Status: DC | PRN

## 2018-06-07 MED ORDER — SIMETHICONE 80 MG PO CHEW: 80.0000 mg | CHEWABLE_TABLET | ORAL | Status: DC | PRN

## 2018-06-07 MED ORDER — DIPHENHYDRAMINE HCL 25 MG PO CAPS: 25.0000 mg | ORAL_CAPSULE | Freq: Four times a day (QID) | ORAL | Status: DC | PRN

## 2018-06-07 MED ORDER — OXYCODONE-ACETAMINOPHEN 5-325 MG PO TABS: 2.0000 | ORAL_TABLET | ORAL | Status: DC | PRN

## 2018-06-07 MED ORDER — LIDOCAINE HCL (PF) 1 % IJ SOLN: 30.0000 mL | INTRAMUSCULAR | Status: DC | PRN

## 2018-06-07 MED ORDER — OXYTOCIN 40 UNITS IN NORMAL SALINE INFUSION - SIMPLE MED
2.5000 [IU]/h | INTRAVENOUS | Status: DC
  Filled 2018-06-07: qty 1000

## 2018-06-07 MED ORDER — IBUPROFEN 600 MG PO TABS
600.0000 mg | ORAL_TABLET | Freq: Four times a day (QID) | ORAL | Status: DC
  Administered 2018-06-07 – 2018-06-09 (×8): 600 mg via ORAL
  Filled 2018-06-07 (×8): qty 1

## 2018-06-07 MED ORDER — WITCH HAZEL-GLYCERIN EX PADS: 1.0000 "application " | MEDICATED_PAD | CUTANEOUS | Status: DC | PRN

## 2018-06-07 MED ORDER — SOD CITRATE-CITRIC ACID 500-334 MG/5ML PO SOLN: 30.0000 mL | ORAL | Status: DC | PRN

## 2018-06-07 MED ORDER — OXYCODONE HCL 5 MG PO TABS: 5.0000 mg | ORAL_TABLET | ORAL | Status: DC | PRN

## 2018-06-07 MED ORDER — ACETAMINOPHEN 325 MG PO TABS: 650.0000 mg | ORAL_TABLET | ORAL | Status: DC | PRN

## 2018-06-07 MED ORDER — METHYLERGONOVINE MALEATE 0.2 MG/ML IJ SOLN
0.2000 mg | Freq: Once | INTRAMUSCULAR | Status: AC
  Administered 2018-06-07: 0.2 mg via INTRAMUSCULAR
  Filled 2018-06-07: qty 1

## 2018-06-07 MED ORDER — SODIUM CHLORIDE 0.9 % IV SOLN: 2.0000 g | Freq: Once | INTRAVENOUS | Status: DC

## 2018-06-07 MED ORDER — OXYCODONE HCL 5 MG PO TABS: 10.0000 mg | ORAL_TABLET | ORAL | Status: DC | PRN

## 2018-06-07 MED ORDER — PRENATAL MULTIVITAMIN CH
1.0000 | ORAL_TABLET | Freq: Every day | ORAL | Status: DC
  Administered 2018-06-08: 1 via ORAL
  Filled 2018-06-07: qty 1

## 2018-06-07 MED ORDER — TETANUS-DIPHTH-ACELL PERTUSSIS 5-2.5-18.5 LF-MCG/0.5 IM SUSP: 0.5000 mL | Freq: Once | INTRAMUSCULAR | Status: DC

## 2018-06-07 MED ORDER — COCONUT OIL OIL: 1.0000 "application " | TOPICAL_OIL | Status: DC | PRN

## 2018-06-07 MED ORDER — SENNOSIDES-DOCUSATE SODIUM 8.6-50 MG PO TABS
2.0000 | ORAL_TABLET | ORAL | Status: DC
  Administered 2018-06-08 (×2): 2 via ORAL
  Filled 2018-06-07 (×2): qty 2

## 2018-06-07 NOTE — MAU Note (Signed)
Presents with c/o ctxs since 0730 this morning.  Denies VB.  Reports +FM

## 2018-06-07 NOTE — H&P (Signed)
Shelley Bishop is a 28 [id number] female presenting at 5 0/7wks for painful regular contractions. Pt noted to be 6cm dilated in MAU ( was 4cm in office at last check). She is dated per LMP; confirmed with first trimester [place]. Her pregnancy has been complicated by GDM - diet controlled. She is GBS positive - no allergies. She declined genetic screening OB History    Gravida  3   Para  2   Term  2   Preterm      AB      Living  2     SAB      TAB      Ectopic      Multiple  0   Live Births  2          Past Medical History:  Diagnosis Date  . IUGR (intrauterine growth restriction)   . Medical history non-contributory    Past Surgical History:  Procedure Laterality Date  . NO PAST SURGERIES     Family History: family history includes Hypertension in her mother. Social History:  reports that she has never smoked. She has never used smokeless tobacco. She reports that she does not drink alcohol or use drugs.     Maternal Diabetes: Yes:  Diabetes Type:  Diet controlled Genetic Screening: Declined Maternal Ultrasounds/Referrals: Abnormal:  Findings:   Other:maternal dermoid cysts -s table Fetal Ultrasounds or other Referrals:  None Maternal Substance Abuse:  No Significant Maternal Medications:  None Significant Maternal Lab Results:  Lab values include: Group B Strep positive Other Comments:  None  Review of Systems  Constitutional: Negative for chills, fever, malaise/fatigue and weight loss.  Eyes: Negative for blurred vision and double vision.  Respiratory: Negative for shortness of breath.   Cardiovascular: Negative for chest pain, palpitations and leg swelling.  Gastrointestinal: Positive for abdominal pain. Negative for heartburn, nausea and vomiting.  Genitourinary: Negative for dysuria.  Musculoskeletal: Negative for myalgias.  Skin: Negative for itching and rash.  Neurological: Negative for dizziness and headaches.  Endo/Heme/Allergies: Does not  bruise/bleed easily.  Psychiatric/Behavioral: Negative for depression, hallucinations, substance abuse and suicidal ideas. The patient is not nervous/anxious.    Maternal Medical History:  Reason for admission: Contractions.  Nausea.  Contractions: Onset was 3-5 hours ago.   Frequency: regular.   Perceived severity is moderate.    Fetal activity: Perceived fetal activity is normal.   Last perceived fetal movement was within the past hour.    Prenatal complications: no prenatal complications Prenatal Complications - Diabetes: gestational. Diabetes is managed by diet.      Dilation: 6 Effacement (%): 80 Station: -2 Exam by:: F. Morris, RNC Blood pressure 104/77, pulse (!) 101, temperature 98 F (36.7 C), temperature source Oral, resp. rate 18, last menstrual period 08/27/2017, SpO2 100 %, unknown if currently breastfeeding. Maternal Exam:  Uterine Assessment: Contraction strength is moderate.  Contraction frequency is regular.   Abdomen: Patient reports generalized tenderness.  Estimated fetal weight is AGA.   Fetal presentation: vertex  Introitus: Normal vulva. Vulva is negative for condylomata and lesion.  Normal vagina.  Vagina is negative for condylomata.  Pelvis: adequate for delivery.   Cervix: Cervix evaluated by digital exam.     Fetal Exam Fetal Monitor Review: Variability: moderate (6-25 bpm).   Pattern: accelerations present.    Fetal State Assessment: Category I - tracings are normal.     Physical Exam  Constitutional: She is oriented to person, place, and time. She appears well-developed and  well-nourished.  Neck: Normal range of motion.  Cardiovascular: Normal rate.  Respiratory: Effort normal.  GI: Soft. There is generalized abdominal tenderness.  Genitourinary:    Vulva, vagina and uterus normal.     No vulval condylomata or lesion noted.   Musculoskeletal: Normal range of motion.  Neurological: She is alert and oriented to person, place, and  time.  Skin: Skin is warm.  Psychiatric: She has a normal mood and affect. Her behavior is normal. Judgment and thought content normal.    Prenatal labs: ABO, Rh:   Antibody:   Rubella:   RPR:    HBsAg:    HIV:    GBS:     Assessment/Plan: 28yo G3P2002 at 39 0/7wks in active labor- admit Ampicillin for GBS treatment Pain control prn pt request Anticipate svd    Shelley Bishop 06/07/2018, 9:42 AM

## 2018-06-07 NOTE — Lactation Note (Signed)
This note was copied from a baby's chart. Lactation Consultation Note  Patient Name: Shelley Bishop YIFOY'[id number] Date: 06/07/2018 Reason for consult: Initial assessment;Term  P3 mother whose infant is now 70 hours old.  Mother breast fed her other 2 children, one for 6 months and the other child for 1 year  Baby was latched onto the left breast in the cradle hold when I arrived.  Baby was laying flat on her back with her head turned toward the breast.  She was sucking on the nipple only.  I offered to assist with repositioning and latching and mother agreeable.  After turning baby on her side to feed and latching deeper with flanged lips mother felt more comfortable.  She denied pain.  Encouraged to feed 8-12 times/24 hours or sooner if baby shows feeding cues.  Mother is familiar with hand expression.  Colostrum container provided for any EBM she obtains.  Milk storage times reviewed and finger feeding demonstrated.  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  She does have private insurance.  Informed her that she may want to contact her insurance company to obtain a pump if desired.  Informed mother that she will receive a manual pump on the day of discharge.  Mom made aware of O/P services, breastfeeding support groups, community resources, and our phone # for post-discharge questions.   Encouraged mother to call for latch assistance as needed.  Father present.    Maternal Data Formula Feeding for Exclusion: No Has patient been taught Hand Expression?: Yes Does the patient have breastfeeding experience prior to this delivery?: Yes  Feeding Feeding Type: Breast Fed  LATCH Score Latch: Grasps breast easily, tongue down, lips flanged, rhythmical sucking.  Audible Swallowing: None  Type of Nipple: Everted at rest and after stimulation  Comfort (Breast/Nipple): Soft / non-tender  Hold (Positioning): Assistance needed to correctly position  infant at breast and maintain latch.  LATCH Score: 7  Interventions Interventions: Breast feeding basics reviewed;Assisted with latch;Skin to skin;Adjust position  Lactation Tools Discussed/Used     Consult Status Consult Status: Follow-up Date: 06/08/18 Follow-up type: In-patient    Little Ishikawa 06/07/2018, 10:43 PM

## 2018-06-07 NOTE — Progress Notes (Signed)
Patient ID: Shelley Bishop, female   DOB: 1990-12-19, 28 y.o.   MRN: [id number] While in OR with c/s for a different patient, I was informed patients bleeding had been moderate and passed a large clot. Pt asymptomatic. I advised a dose of methergine be given I examined pt once I was out of OR. Fundus noted to be firm and bleeding scant. Improvement noted per nurse Junie Panning since I was notified.Pt was still asymptomatic; VSS Pt to be monitored a little longer before transferred to postpartum floor

## 2018-06-08 LAB — ABO/RH: ABO/RH(D): B POS

## 2018-06-08 LAB — CBC
HCT: 34.1 % — ABNORMAL LOW (ref 36.0–46.0)
Hemoglobin: 11.3 g/dL — ABNORMAL LOW (ref 12.0–15.0)
MCH: 27.7 pg (ref 26.0–34.0)
MCHC: 33.1 g/dL (ref 30.0–36.0)
MCV: 83.6 fL (ref 80.0–100.0)
Platelets: 256 10*3/uL (ref 150–400)
RBC: 4.08 MIL/uL (ref 3.87–5.11)
RDW: 14.5 % (ref 11.5–15.5)
WBC: 11.5 10*3/uL — ABNORMAL HIGH (ref 4.0–10.5)
nRBC: 0 % (ref 0.0–0.2)

## 2018-06-08 NOTE — Progress Notes (Signed)
Post Partum Day 1 Subjective: no complaints, up ad lib, voiding, tolerating PO and nl lochia, pain controlled  Objective: Blood pressure 102/67, pulse 68, temperature 98.7 F (37.1 C), temperature source Oral, resp. rate 18, last menstrual period 08/27/2017, SpO2 100 %, unknown if currently breastfeeding.  Physical Exam:  General: alert and no distress Lochia: appropriate Uterine Fundus: firm   Recent Labs    06/07/18 0946 06/08/18 0601  HGB 12.0 11.3*  HCT 37.3 34.1*    Assessment/Plan: Plan for discharge tomorrow, Breastfeeding and Lactation consult.  Routine PP care.     LOS: 1 day   Shelley Bishop 06/08/2018, 7:27 AM

## 2018-06-08 NOTE — Lactation Note (Signed)
This note was copied from a baby's chart. Lactation Consultation Note  Patient Name: Shelley Bishop [id number] Date: 06/08/2018 Reason for consult: Follow-up assessment Baby is 22 hours old.  Mom reports that baby is breastfeeding well.  She has no concerns or questions.  Instructed to feed with cues and call for assist prn.  Maternal Data    Feeding Feeding Type: Breast Fed  LATCH Score                   Interventions    Lactation Tools Discussed/Used     Consult Status Consult Status: Follow-up Date: 06/09/18 Follow-up type: In-patient    Ave Filter 06/08/2018, 12:48 PM

## 2018-06-09 LAB — RPR: RPR Ser Ql: NONREACTIVE

## 2018-06-09 MED ORDER — ACETAMINOPHEN 325 MG PO TABS: 650.0000 mg | ORAL_TABLET | ORAL | 0 refills | Status: DC | PRN

## 2018-06-09 MED ORDER — IBUPROFEN 600 MG PO TABS: 600.0000 mg | ORAL_TABLET | Freq: Four times a day (QID) | ORAL | 0 refills | Status: DC

## 2018-06-09 NOTE — Lactation Note (Signed)
This note was copied from a baby's chart. Lactation Consultation Note  Patient Name: Shelley Bishop [id number] Date: 06/09/2018   Mom has no questions or concerns. Mom says her breasts are full. Mom was observed to be successfully doing breast compressions while infant at breast.  Last stool was greater than 24 hours ago, but infant had 8 stools in the 1st 24 hours of life.   [name] [facility] 06/09/2018, 11:27 AM

## 2018-06-09 NOTE — Discharge Summary (Signed)
OB Discharge Summary     Patient Name: Shelley Bishop DOB: 03-May-1990 MRN: [id number]  Date of admission: 06/07/2018 Delivering MD: Carlynn Purl [facility]   Date of discharge: 06/09/2018  Admitting diagnosis: CTX 3 TO 4 MINS Intrauterine pregnancy: [redacted]w[redacted]d     Secondary diagnosis:  Active Problems:   Indication for care in labor or delivery   Precipitous delivery   Postpartum care following vaginal delivery  Additional problems: none     Discharge diagnosis: Term Pregnancy Delivered                                                                                                Post partum procedures:none  Complications: None  Hospital course:  Onset of Labor With Vaginal Delivery     28 y.o. yo G3P3003 at [redacted]w[redacted]d was admitted in Active Labor on 06/07/2018. Patient had an uncomplicated labor course as follows:  Membrane Rupture Time/Date: 10:03 AM ,06/07/2018   Intrapartum Procedures: Episiotomy: None [1]                                         Lacerations:  None [1]  Patient had a delivery of a Viable infant. 06/07/2018  Information for the patient's newborn:  Shelley Bishop, Girl Shelley Bishop [[id number]]       Pateint had an uncomplicated postpartum course.  She is ambulating, tolerating a regular diet, passing flatus, and urinating well. Patient is discharged home in stable condition on 06/09/18.   Physical exam  Vitals:   06/07/18 2130 06/08/18 0130 06/08/18 1455 06/09/18 0537  BP: 106/66 102/67 100/61 102/75  Pulse: 72 68 (!) 58 77  Resp: 18 18 16 18   Temp: 98.6 F (37 C) 98.7 F (37.1 C) 99.1 F (37.3 C) 98.1 F (36.7 C)  TempSrc: Oral Oral Oral Oral  SpO2:    100%   General: alert and cooperative Lochia: appropriate Uterine Fundus: firm  Labs: Lab Results  Component Value Date   WBC 11.5 (H) 06/08/2018   HGB 11.3 (L) 06/08/2018   HCT 34.1 (L) 06/08/2018   MCV 83.6 06/08/2018   PLT 256 06/08/2018   CMP Latest Ref Rng & Units 10/24/2017  Glucose 70 - 99  mg/dL 109(H)  BUN 6 - 20 mg/dL <5(L)  Creatinine 0.44 - 1.00 mg/dL 0.58  Sodium 135 - 145 mmol/L 135  Potassium 3.5 - 5.1 mmol/L 3.9  Chloride 98 - 111 mmol/L 100  CO2 22 - 32 mmol/L 26  Calcium 8.9 - 10.3 mg/dL 9.3  Total Protein 6.5 - 8.1 g/dL 7.6  Total Bilirubin 0.3 - 1.2 mg/dL 0.7  Alkaline Phos 38 - 126 U/L 61  AST 15 - 41 U/L 13(L)  ALT 0 - 44 U/L 11    Discharge instruction: per After Visit Summary and "Baby and Me Booklet".  After visit meds:  Allergies as of 06/09/2018   No Known Allergies     Medication List    STOP taking these medications   metFORMIN 750 MG 24  hr tablet Commonly known as:  GLUCOPHAGE-XR     TAKE these medications   acetaminophen 325 MG tablet Commonly known as:  Tylenol Take 2 tablets (650 mg total) by mouth every 4 (four) hours as needed (for pain scale < 4).   ibuprofen 600 MG tablet Commonly known as:  ADVIL Take 1 tablet (600 mg total) by mouth every 6 (six) hours.       Diet: routine diet  Activity: Advance as tolerated. Pelvic rest for 6 weeks.   Outpatient follow up:6 weeks Follow up Appt:No future appointments. Follow up Visit:No follow-ups on file.  Postpartum contraception: Nexplanon and IUD Mirena--considering  Newborn Data: Live born female  Birth Weight: 6 lb 13.7 oz (3110 g) APGAR: 6, 9  Newborn Delivery   Birth date/time:  06/07/2018 10:10:00 Delivery type:  Vaginal, Spontaneous     Baby Feeding: Breast Disposition:home with mother   06/09/2018 Logan Bores, MD

## 2018-06-09 NOTE — Progress Notes (Signed)
Post Partum Day 2 Subjective: no complaints, up ad lib and tolerating PO  Objective: Blood pressure 102/75, pulse 77, temperature 98.1 F (36.7 C), temperature source Oral, resp. rate 18, last menstrual period 08/27/2017, SpO2 100 %, unknown if currently breastfeeding.  Physical Exam:  General: alert and cooperative Lochia: appropriate Uterine Fundus: firm   Recent Labs    06/07/18 0946 06/08/18 0601  HGB 12.0 11.3*  HCT 37.3 34.1*    Assessment/Plan: Discharge home Considering nexplanon vs IUD   LOS: 2 days   Logan Bores 06/09/2018, 11:01 AM

## 2018-06-24 ENCOUNTER — Emergency Department (HOSPITAL_COMMUNITY)
Admission: EM | Admit: 2018-06-24 | Discharge: 2018-06-24 | Disposition: A | Discharge status: Home / Self Care | Payer: BLUE CROSS/BLUE SHIELD | Attending: Emergency Medicine | Admitting: Emergency Medicine

## 2018-06-24 ENCOUNTER — Emergency Department (HOSPITAL_COMMUNITY): Payer: BLUE CROSS/BLUE SHIELD

## 2018-06-24 ENCOUNTER — Other Ambulatory Visit: Payer: Self-pay

## 2018-06-24 ENCOUNTER — Encounter (HOSPITAL_COMMUNITY): Payer: Self-pay | Admitting: Emergency Medicine

## 2018-06-24 DIAGNOSIS — R0789 Other chest pain: Secondary | ICD-10-CM | POA: Insufficient documentation

## 2018-06-24 DIAGNOSIS — O9089 Other complications of the puerperium, not elsewhere classified: Secondary | ICD-10-CM | POA: Insufficient documentation

## 2018-06-24 DIAGNOSIS — Z8632 Personal history of gestational diabetes: Secondary | ICD-10-CM | POA: Diagnosis not present

## 2018-06-24 DIAGNOSIS — Z20828 Contact with and (suspected) exposure to other viral communicable diseases: Secondary | ICD-10-CM | POA: Insufficient documentation

## 2018-06-24 DIAGNOSIS — R932 Abnormal findings on diagnostic imaging of liver and biliary tract: Secondary | ICD-10-CM | POA: Diagnosis not present

## 2018-06-24 DIAGNOSIS — R1011 Right upper quadrant pain: Secondary | ICD-10-CM | POA: Insufficient documentation

## 2018-06-24 DIAGNOSIS — K8080 Other cholelithiasis without obstruction: Secondary | ICD-10-CM | POA: Insufficient documentation

## 2018-06-24 LAB — COMPREHENSIVE METABOLIC PANEL
ALT: 37 U/L (ref 0–44)
AST: 48 U/L — ABNORMAL HIGH (ref 15–41)
Albumin: 3.4 g/dL — ABNORMAL LOW (ref 3.5–5.0)
Alkaline Phosphatase: 129 U/L — ABNORMAL HIGH (ref 38–126)
Anion gap: 8 (ref 5–15)
BUN: 12 mg/dL (ref 6–20)
CO2: 27 mmol/L (ref 22–32)
Calcium: 9.9 mg/dL (ref 8.9–10.3)
Chloride: 105 mmol/L (ref 98–111)
Creatinine, Ser: 0.74 mg/dL (ref 0.44–1.00)
GFR calc Af Amer: >60 mL/min (ref >60)
GFR calc non Af Amer: >60 mL/min (ref >60)
Glucose, Bld: 87 mg/dL (ref 70–99)
Potassium: 4 mmol/L (ref 3.5–5.1)
Sodium: 140 mmol/L (ref 135–145)
Total Bilirubin: 0.5 mg/dL (ref 0.3–1.2)
Total Protein: 7.2 g/dL (ref 6.5–8.1)

## 2018-06-24 LAB — CBC
HCT: 42.4 % (ref 36.0–46.0)
Hemoglobin: 13.6 g/dL (ref 12.0–15.0)
MCH: 27.4 pg (ref 26.0–34.0)
MCHC: 32.1 g/dL (ref 30.0–36.0)
MCV: 85.5 fL (ref 80.0–100.0)
Platelets: 361 10*3/uL (ref 150–400)
RBC: 4.96 MIL/uL (ref 3.87–5.11)
RDW: 14 % (ref 11.5–15.5)
WBC: 8.4 10*3/uL (ref 4.0–10.5)
nRBC: 0 % (ref 0.0–0.2)

## 2018-06-24 LAB — TROPONIN I: Troponin I: <0.03 ng/mL (ref <0.03)

## 2018-06-24 LAB — LIPASE, BLOOD: Lipase: 39 U/L (ref 11–51)

## 2018-06-24 LAB — PREGNANCY, URINE: Preg Test, Ur: NEGATIVE

## 2018-06-24 LAB — SARS CORONAVIRUS 2: SARS Coronavirus 2: NOT DETECTED

## 2018-06-24 MED ORDER — MORPHINE SULFATE (PF) 4 MG/ML IV SOLN
4.0000 mg | Freq: Once | INTRAVENOUS | Status: AC
  Administered 2018-06-24: 16:00:00 4 mg via INTRAVENOUS
  Filled 2018-06-24: qty 1

## 2018-06-24 MED ORDER — ONDANSETRON HCL 4 MG/2ML IJ SOLN
4.0000 mg | Freq: Once | INTRAMUSCULAR | Status: AC
  Administered 2018-06-24: 16:00:00 4 mg via INTRAVENOUS
  Filled 2018-06-24: qty 2

## 2018-06-24 MED ORDER — SODIUM CHLORIDE 0.9 % IV SOLN
2.0000 g | Freq: Once | INTRAVENOUS | Status: AC
  Administered 2018-06-24: 2 g via INTRAVENOUS
  Filled 2018-06-24: qty 20

## 2018-06-24 MED ORDER — SODIUM CHLORIDE 0.9 % IV BOLUS
1000.0000 mL | Freq: Once | INTRAVENOUS | Status: AC
  Administered 2018-06-24: 1000 mL via INTRAVENOUS

## 2018-06-24 MED ORDER — IOHEXOL 350 MG/ML SOLN
100.0000 mL | Freq: Once | INTRAVENOUS | Status: AC | PRN
  Administered 2018-06-24: 100 mL via INTRAVENOUS

## 2018-06-24 NOTE — Consult Note (Signed)
Reason for Consult:Gallstones Referring Physician: Johnney Killian, EDP  Shelley Bishop is an 28 y.o. female.  HPI: This is a 28 year old female from [place] who is 2 weeks post-partum.  She is breast-feeding.  Initially after delivery, she noticed some lower midline chest pain and has intermittent episodes over the last two weeks.  She presented today around 1 pm with sharp pressure in her central chest, with one episode of vomiting.  She presented to the ED for evaluation - CTA was negative.  She has not had any pain meds for the last four hours and is completely pain-free.  She is hungry.  No nausea.  No diarrhea.  Past Medical History:  Diagnosis Date  . IUGR (intrauterine growth restriction)   . Medical history non-contributory   . Precipitous delivery 06/07/2018    Past Surgical History:  Procedure Laterality Date  . NO PAST SURGERIES      Family History  Problem Relation Age of Onset  . Hypertension Mother     Social History:  reports that she has never smoked. She has never used smokeless tobacco. She reports that she does not drink alcohol or use drugs.  Allergies: No Known Allergies  Medications:  Prior to Admission medications   Medication Sig Start Date End Date Taking? Authorizing Provider  acetaminophen (TYLENOL) 325 MG tablet Take 2 tablets (650 mg total) by mouth every 4 (four) hours as needed (for pain scale < 4). 06/09/18   Paula Compton, MD  ibuprofen (ADVIL) 600 MG tablet Take 1 tablet (600 mg total) by mouth every 6 (six) hours. 06/09/18   Paula Compton, MD     Results for orders placed or performed during the hospital encounter of 06/24/18 (from the past 48 hour(s))  CBC     Status: None   Collection Time: 06/24/18  3:22 PM  Result Value Ref Range   WBC 8.4 4.0 - 10.5 K/uL   RBC 4.96 3.87 - 5.11 MIL/uL   Hemoglobin 13.6 12.0 - 15.0 g/dL   HCT 42.4 36.0 - 46.0 %   MCV 85.5 80.0 - 100.0 fL   MCH 27.4 26.0 - 34.0 pg   MCHC 32.1 30.0 - 36.0 g/dL   RDW 14.0 11.5 - 15.5 %   Platelets 361 150 - 400 K/uL   nRBC 0.0 0.0 - 0.2 %    Comment: Performed at [facility], [place] [address] [place], [place] [address]  Troponin I - ONCE - STAT     Status: None   Collection Time: 06/24/18  3:22 PM  Result Value Ref Range   Troponin I <0.03 <0.03 ng/mL    Comment: Performed at [facility], [place] [address], [place], [place] [address]  Comprehensive metabolic panel     Status: Abnormal   Collection Time: 06/24/18  3:31 PM  Result Value Ref Range   Sodium 140 135 - 145 mmol/L   Potassium 4.0 3.5 - 5.1 mmol/L   Chloride 105 98 - 111 mmol/L   CO2 27 22 - 32 mmol/L   Glucose, Bld 87 70 - 99 mg/dL   BUN 12 6 - 20 mg/dL   Creatinine, Ser 0.74 0.44 - 1.00 mg/dL   Calcium 9.9 8.9 - 10.3 mg/dL   Total Protein 7.2 6.5 - 8.1 g/dL   Albumin 3.4 (L) 3.5 - 5.0 g/dL   AST 48 (H) 15 - 41 U/L   ALT 37 0 - 44 U/L   Alkaline Phosphatase 129 (H) 38 -  126 U/L   Total Bilirubin 0.5 0.3 - 1.2 mg/dL   GFR calc non Af Amer >60 >60 mL/min   GFR calc Af Amer >60 >60 mL/min   Anion gap 8 5 - 15    Comment: Performed at [facility] [address], [place], [place] [address]  Lipase, blood     Status: None   Collection Time: 06/24/18  3:31 PM  Result Value Ref Range   Lipase 39 11 - 51 U/L    Comment: Performed at [facility] [address] [place], [place] [address]  Pregnancy, urine     Status: None   Collection Time: 06/24/18  5:51 PM  Result Value Ref Range   Preg Test, Ur NEGATIVE NEGATIVE    Comment:        THE SENSITIVITY OF THIS METHODOLOGY IS >20 mIU/mL. Performed at [facility], [place] [address], [place],  [address]     Ct Angio Chest Pe W And/or Wo Contrast  Result Date: 06/24/2018 CLINICAL DATA:  Postpartum central chest pain starting today question pulmonary embolism, epigastric and RIGHT upper quadrant tenderness EXAM: CT ANGIOGRAPHY CHEST WITH CONTRAST TECHNIQUE: Multidetector CT imaging of the chest was  performed using the standard protocol during bolus administration of intravenous contrast. Multiplanar CT image reconstructions and MIPs were obtained to evaluate the vascular anatomy. CONTRAST:  155mL OMNIPAQUE IOHEXOL 350 MG/ML SOLN IV COMPARISON:  None FINDINGS: Cardiovascular: Aorta normal caliber without aneurysm or dissection. Slight prominence of cardiac chamber size consistent with recent postpartum state. Pulmonary arteries well opacified and patent. No evidence of pulmonary embolism. No pericardial effusion. Mediastinum/Nodes: Esophagus unremarkable. Base of cervical region normal appearance. No thoracic adenopathy. Lungs/Pleura: Lungs clear. No pulmonary infiltrate, pleural effusion or pneumothorax. Upper Abdomen: Visualized upper abdomen unremarkable. Visualized portion of gallbladder normal appearance by CT, entirety of gallbladder not visualized. Musculoskeletal: Osseous structures unremarkable. Review of the MIP images confirms the above findings. IMPRESSION: Normal CTA chest. Electronically Signed   By: Lavonia Dana M.D.   On: 06/24/2018 17:30   Dg Chest Port 1 View  Result Date: 06/24/2018 CLINICAL DATA:  Center chest pain x1 week. Pt denies SOB. Pt states she gave birth APPROX.2 weeks ago and the chest pain started immediately after but subsided in a couple days, then returned APPROX.1 week ago. Nonsmoker. EXAM: PORTABLE CHEST 1 VIEW COMPARISON:  None. FINDINGS: The heart size and mediastinal contours are within normal limits. Both lungs are clear. The visualized skeletal structures are unremarkable. IMPRESSION: No active disease. Electronically Signed   By: Van Clines M.D.   On: 06/24/2018 15:41   US Abdomen Limited Ruq  Result Date: 06/24/2018 CLINICAL DATA:  Right upper quadrant pain for 2 weeks. Elevated liver function test. EXAM: ULTRASOUND ABDOMEN LIMITED RIGHT UPPER QUADRANT COMPARISON:  None. FINDINGS: Gallbladder: Small mobile stone. Wall is edematous. Gallbladder is  moderately distended. Trace pericholecystic fluid. Wall thickness 4 mm. Stone measures 5 mm. No sonographic Murphy's sign. Common bile duct: Diameter: 4 mm proximally tapering to 3 mm.  No duct stone. Liver: Normal liver size and parenchymal echogenicity. No mass or focal lesion. Portal vein is patent on color Doppler imaging with normal direction of blood flow towards the liver. IMPRESSION: 1. Small gallstone with gallbladder wall thickening and edema, but a negative sonographic Murphy's sign. Trace pericholecystic fluid. The trace pericholecystic fluid and wall thickening may be reactive. Consider acute cholecystitis in proper clinical setting. If there are clinical findings consistent with hepatic  inflammation, the gallbladder finding should be considered likely reactive. 2. No other abnormalities.  No bile duct dilation. Electronically Signed   By: Lajean Manes M.D.   On: 06/24/2018 18:51    Review of Systems  Constitutional: Negative for weight loss.  HENT: Negative for ear discharge, ear pain, hearing loss and tinnitus.   Eyes: Negative for blurred vision, double vision, photophobia and pain.  Respiratory: Negative for cough, sputum production and shortness of breath.   Cardiovascular: Positive for chest pain.  Gastrointestinal: Positive for abdominal pain, nausea and vomiting.  Genitourinary: Negative for dysuria, flank pain, frequency and urgency.  Musculoskeletal: Negative for back pain, falls, joint pain, myalgias and neck pain.  Neurological: Negative for dizziness, tingling, sensory change, focal weakness, loss of consciousness and headaches.  Endo/Heme/Allergies: Does not bruise/bleed easily.  Psychiatric/Behavioral: Negative for depression, memory loss and substance abuse. The patient is not nervous/anxious.    Blood pressure 111/66, pulse (!) 56, temperature 98.7 F (37.1 C), temperature source Oral, resp. rate 14, height 5\' 3"  (1.6 m), weight 65.8 kg, last menstrual period  06/24/2018, SpO2 98 %, unknown if currently breastfeeding. Physical Exam WDWN in NAD Eyes:  Pupils equal, round; sclera anicteric HENT:  Oral mucosa moist; good dentition  Neck:  No masses palpated, no thyromegaly Lungs:  CTA bilaterally; normal respiratory effort CV:  Regular rate and rhythm; no murmurs; extremities well-perfused with no edema Abd:  +bowel sounds, soft, non-tender, no palpable organomegaly; no palpable hernias Skin:  Warm, dry; no sign of jaundice Psychiatric - alert and oriented x 4; calm mood and affect  Assessment/Plan: Symptomatic cholelithiasis - currently asymptomatic with no sign of acute cholecystitis  May be discharged on low-fat diet.  Follow-up with me in the office in the next 2 weeks to plan elective cholecystectomy.  Imogene Burn Carlena Ruybal 06/24/2018, 7:57 PM

## 2018-06-24 NOTE — Discharge Instructions (Addendum)
Your work-up overall has been reassuring today.  CT scan did not show any evidence of blood clot and everything looks good with your heart.  Your gallbladder ultrasound showed some inflammation today with a very small gallstone, Dr. Georgette Dover would like to follow-up with you in the office given that your pain has significantly improved and your labs overall look good today.  If you have worsening upper abdominal pain persistent nausea and vomiting, fevers or any other new or concerning symptoms please return to the emergency department.

## 2018-06-24 NOTE — ED Triage Notes (Signed)
Pt to ED with c/o mid chest pain onset 2 weeks ago after giving birth.  Pt st's  The pain comes and goes and is sharp.  Pt denies any cough or shortness of breath

## 2018-06-24 NOTE — ED Provider Notes (Signed)
[facility] EMERGENCY DEPARTMENT Provider Note   CSN: [id number] Arrival date & time: 06/24/18  1513    History   Chief Complaint Chief Complaint  Patient presents with   Chest Pain    HPI Shelley Bishop is a 28 y.o. female.     Shelley Bishop is a 28 y.o. female 2 weeks postpartum after uncomplicated vaginal delivery, who presents for evaluation of intermittent chest pain.  She reports she first noticed chest pains immediately after delivery and has had intermittent episodes over the past 2 weeks since then.  She reports central chest pain described as a sharp pressure.  Pain is nonradiating.  Pain is not exertional it is worse with certain bending and movements.  Pain is not pleuritic.  She denies any associated fever or cough.  No lightheadedness or syncope.  No lower extremity swelling or pain.  She is also noted some upper abdominal pain and this morning had one episode of emesis.  Emesis and stools have been nonbloody.  Denies any dysuria or urinary frequency.  No vaginal bleeding.  Has recovered well from delivery with no complications.  Pregnancy itself was only complicated by gestational diabetes.  No prior history of heart problems, PE or DVT.  Patient is currently breast-feeding not having any issues or focal breast pain.     Past Medical History:  Diagnosis Date   IUGR (intrauterine growth restriction)    Medical history non-contributory    Precipitous delivery 06/07/2018    Patient Active Problem List   Diagnosis Date Noted   Indication for care in labor or delivery 06/07/2018   Precipitous delivery 06/07/2018   Postpartum care following vaginal delivery 06/07/2018   Abnormal glucose tolerance test (GTT) during pregnancy, antepartum 04/05/2018   Active labor 06/15/2015   Supervision of high-risk pregnancy 11/20/2012   Symmetric IUGR complicating pregnancy, antepartum 11/20/2012    Past Surgical History:  Procedure  Laterality Date   NO PAST SURGERIES       OB History    Gravida  3   Para  3   Term  3   Preterm      AB      Living  3     SAB      TAB      Ectopic      Multiple  0   Live Births  3            Home Medications    Prior to Admission medications   Medication Sig Start Date End Date Taking? Authorizing Provider  acetaminophen (TYLENOL) 325 MG tablet Take 2 tablets (650 mg total) by mouth every 4 (four) hours as needed (for pain scale < 4). 06/09/18   Paula Compton, MD  ibuprofen (ADVIL) 600 MG tablet Take 1 tablet (600 mg total) by mouth every 6 (six) hours. 06/09/18   Paula Compton, MD    Family History Family History  Problem Relation Age of Onset   Hypertension Mother     Social History Social History   Tobacco Use   Smoking status: Never Smoker   Smokeless tobacco: Never Used  Substance Use Topics   Alcohol use: No    Alcohol/week: 0.0 standard drinks   Drug use: No     Allergies   Patient has no known allergies.   Review of Systems Review of Systems  Constitutional: Negative for chills and fever.  HENT: Negative.   Eyes: Negative for visual disturbance.  Respiratory: Negative for cough  and shortness of breath.   Cardiovascular: Positive for chest pain. Negative for palpitations and leg swelling.  Gastrointestinal: Positive for abdominal pain, nausea and vomiting. Negative for constipation and diarrhea.  Genitourinary: Negative for dysuria, frequency, vaginal bleeding and vaginal discharge.  Musculoskeletal: Negative for arthralgias and myalgias.  Skin: Negative for color change and rash.  Neurological: Negative for dizziness, syncope and light-headedness.     Physical Exam Updated Vital Signs BP (!) 112/93    Pulse 71    Temp 98.2 F (36.8 C) (Oral)    Resp (!) 21    Ht '5\' 3"'  (1.6 m)    Wt 65.8 kg    LMP 06/24/2018    SpO2 99%    BMI 25.69 kg/m   Physical Exam Vitals signs and nursing note reviewed.    Constitutional:      General: She is not in acute distress.    Appearance: She is well-developed and normal weight. She is not ill-appearing or diaphoretic.  HENT:     Head: Normocephalic and atraumatic.  Eyes:     General:        Right eye: No discharge.        Left eye: No discharge.     Pupils: Pupils are equal, round, and reactive to light.  Neck:     Musculoskeletal: Neck supple.  Cardiovascular:     Rate and Rhythm: Normal rate and regular rhythm.     Pulses:          Radial pulses are 2+ on the right side and 2+ on the left side.       Dorsalis pedis pulses are 2+ on the right side and 2+ on the left side.     Heart sounds: Normal heart sounds. No murmur. No friction rub. No gallop.   Pulmonary:     Effort: Pulmonary effort is normal. No respiratory distress.     Breath sounds: Normal breath sounds. No wheezing or rales.     Comments: Respirations equal and unlabored, patient able to speak in full sentences, lungs clear to auscultation bilaterally Abdominal:     General: Bowel sounds are normal. There is no distension.     Palpations: Abdomen is soft. There is no mass.     Tenderness: There is abdominal tenderness. There is no guarding.     Comments: Abdomen is soft and nondistended, bowel sounds present throughout, patient does have some epigastric and right upper quadrant tenderness without guarding or peritoneal signs, no lower abdominal tenderness, no CVA tenderness bilaterally.  Musculoskeletal:        General: No deformity.     Right lower leg: She exhibits no tenderness. No edema.     Left lower leg: She exhibits no tenderness. No edema.     Comments: Lateral lower extremities warm and well-perfused without edema or tenderness.  Skin:    General: Skin is warm and dry.     Capillary Refill: Capillary refill takes less than 2 seconds.  Neurological:     Mental Status: She is alert.     Coordination: Coordination normal.     Comments: Speech is clear, able to follow  commands Moves extremities without ataxia, coordination intact  Psychiatric:        Mood and Affect: Mood normal.        Behavior: Behavior normal.      ED Treatments / Results  Labs (all labs ordered are listed, but only abnormal results are displayed) Labs Reviewed  COMPREHENSIVE METABOLIC PANEL -  Abnormal; Notable for the following components:      Result Value   Albumin 3.4 (*)    AST 48 (*)    Alkaline Phosphatase 129 (*)    All other components within normal limits  CBC  TROPONIN I  LIPASE, BLOOD  PREGNANCY, URINE    EKG EKG Interpretation  Date/Time:  Saturday June 24 2018 15:26:56 EDT Ventricular Rate:  62 PR Interval:    QRS Duration: 96 QT Interval:  404 QTC Calculation: 411 R Axis:   97 Text Interpretation:  Sinus rhythm no acute ischemic appearance, early repolorization, no old comparison Confirmed by Charlesetta Shanks [phone]) on 06/24/2018 3:55:15 PM   Radiology Ct Angio Chest Pe W And/or Wo Contrast  Result Date: 06/24/2018 CLINICAL DATA:  Postpartum central chest pain starting today question pulmonary embolism, epigastric and RIGHT upper quadrant tenderness EXAM: CT ANGIOGRAPHY CHEST WITH CONTRAST TECHNIQUE: Multidetector CT imaging of the chest was performed using the standard protocol during bolus administration of intravenous contrast. Multiplanar CT image reconstructions and MIPs were obtained to evaluate the vascular anatomy. CONTRAST:  166m OMNIPAQUE IOHEXOL 350 MG/ML SOLN IV COMPARISON:  None FINDINGS: Cardiovascular: Aorta normal caliber without aneurysm or dissection. Slight prominence of cardiac chamber size consistent with recent postpartum state. Pulmonary arteries well opacified and patent. No evidence of pulmonary embolism. No pericardial effusion. Mediastinum/Nodes: Esophagus unremarkable. Base of cervical region normal appearance. No thoracic adenopathy. Lungs/Pleura: Lungs clear. No pulmonary infiltrate, pleural effusion or pneumothorax. Upper  Abdomen: Visualized upper abdomen unremarkable. Visualized portion of gallbladder normal appearance by CT, entirety of gallbladder not visualized. Musculoskeletal: Osseous structures unremarkable. Review of the MIP images confirms the above findings. IMPRESSION: Normal CTA chest. Electronically Signed   By: MLavonia DanaM.D.   On: 06/24/2018 17:30   Dg Chest Port 1 View  Result Date: 06/24/2018 CLINICAL DATA:  Center chest pain x1 week. Pt denies SOB. Pt states she gave birth APPROX.2 weeks ago and the chest pain started immediately after but subsided in a couple days, then returned APPROX.1 week ago. Nonsmoker. EXAM: PORTABLE CHEST 1 VIEW COMPARISON:  None. FINDINGS: The heart size and mediastinal contours are within normal limits. Both lungs are clear. The visualized skeletal structures are unremarkable. IMPRESSION: No active disease. Electronically Signed   By: WVan ClinesM.D.   On: 06/24/2018 15:41   [place]oreaAbdomen Limited Ruq  Result Date: 06/24/2018 CLINICAL DATA:  Right upper quadrant pain for 2 weeks. Elevated liver function test. EXAM: ULTRASOUND ABDOMEN LIMITED RIGHT UPPER QUADRANT COMPARISON:  None. FINDINGS: Gallbladder: Small mobile stone. Wall is edematous. Gallbladder is moderately distended. Trace pericholecystic fluid. Wall thickness 4 mm. Stone measures 5 mm. No sonographic Murphy's sign. Common bile duct: Diameter: 4 mm proximally tapering to 3 mm.  No duct stone. Liver: Normal liver size and parenchymal echogenicity. No mass or focal lesion. Portal vein is patent on color Doppler imaging with normal direction of blood flow towards the liver. IMPRESSION: 1. Small gallstone with gallbladder wall thickening and edema, but a negative sonographic Murphy's sign. Trace pericholecystic fluid. The trace pericholecystic fluid and wall thickening may be reactive. Consider acute cholecystitis in proper clinical setting. If there are clinical findings consistent with hepatic inflammation, the  gallbladder finding should be considered likely reactive. 2. No other abnormalities.  No bile duct dilation. Electronically Signed   By: DLajean ManesM.D.   On: 06/24/2018 18:51    Procedures Procedures (including critical care time)  Medications Ordered in ED Medications  sodium chloride 0.9 %  bolus 1,000 mL (1,000 mLs Intravenous New Bag/Given 06/24/18 1607)  ondansetron (ZOFRAN) injection 4 mg (4 mg Intravenous Given 06/24/18 1608)  morphine 4 MG/ML injection 4 mg (4 mg Intravenous Given 06/24/18 1610)  iohexol (OMNIPAQUE) 350 MG/ML injection 100 mL (100 mLs Intravenous Contrast Given 06/24/18 1657)     Initial Impression / Assessment and Plan / ED Course  I have reviewed the triage vital signs and the nursing notes.  Pertinent labs & imaging results that were available during my care of the patient were reviewed by me and considered in my medical decision making (see chart for details).  28 year old female, 2 weeks postpartum presents with intermittent episodes of chest pain, she also has epigastric and right upper quadrant tenderness on exam and had an episode of nonbloody emesis this morning.  On arrival vitals are stable and patient is well-appearing and in no acute distress.  Lungs are clear.  Given recent delivery she is certainly at risk for PE, I doubt ACS as symptoms have been intermittent over the past 2 weeks seem very atypical we will check basic labs, troponin and EKG.  Will plan for CT angio study of the chest to rule out PE.  Patient also has some right upper quadrant tenderness, question if patient could be having biliary colic or acute cholecystitis causing pain.  Will see if radiology can include gallbladder in CT angios study, patient may ultimately need right upper quadrant ultrasound.  Discussed with patient and she would like something for pain at this time, will give morphine and Zofran discussed with patient that she is currently lactating and she will plan to pump and  dump.  Labs show no leukocytosis, normal hemoglobin, no acute electrolyte derangements, patient does have slightly elevated AST and an alk phos of 129, normal bilirubin, normal lipase.  Troponin was negative and EKG without concerning changes.  Chest x-ray is clear.  CT angioshows no evidence of pulmonary emboli or other acute abnormalities within the chest.  The entire gallbladder was not visualized, the portion that was seen appears normal.  On repeat exam patient continues to exhibit some right upper quadrant tenderness, given that she does have slightly elevated alk phos I discussed with the patient and she would prefer to proceed with right upper quadrant ultrasound to rule out gallbladder issue as cause for her symptoms.  She is feeling much better after pain medication here in the ED.  RUQ US shows small gallstone with gallbladder wall thickening and edema but negative sonographic Murphy sign.  There is trace pericholecystic fluid, this could be reactive versus acute cholecystitis.  On repeat exam patient has very mild tenderness in the right upper quadrant but this is improved from initial exam.  Will discuss with Dr. Georgette Dover with general surgery.  Dr. Georgette Dover to come and evaluate the patient, patient will likely eventually need to have gallbladder removed, but this could potentially be done as an outpatient given no leukocytosis, afebrile and significant improvement in pain.  After Dr. Georgette Dover has seen and evaluated patient he feels she is stable for discharge home and outpatient follow-up with strict return precautions, patient has been educated on maintaining a low-fat diet.  Return precautions discussed at length.  Patient discharged home in good condition.  Final Clinical Impressions(s) / ED Diagnoses   Final diagnoses:  RUQ pain  Atypical chest pain    ED Discharge Orders    None       Jacqlyn Larsen, [place] 06/25/18 1445  Charlesetta Shanks, MD 06/25/18 [id number]

## 2020-02-18 DIAGNOSIS — L700 Acne vulgaris: Secondary | ICD-10-CM | POA: Insufficient documentation

## 2020-05-08 ENCOUNTER — Encounter (HOSPITAL_BASED_OUTPATIENT_CLINIC_OR_DEPARTMENT_OTHER): Payer: Self-pay | Admitting: Obstetrics and Gynecology

## 2020-05-08 ENCOUNTER — Other Ambulatory Visit: Payer: Self-pay

## 2020-05-08 NOTE — Progress Notes (Signed)
Spoke w/ via phone for pre-op interview---pt Lab needs dos---- urine poct (per anesthesia) surgery orders pending             Lab results------none COVID test ------05-12-2020 1000 am Arrive at -------630 am 05-14-2020 NPO after MN NO Solid Food.  Clear liquids from MN until---530 am then npo Med rec completed Medications to take morning of surgery -----none Diabetic medication -----n/a Patient instructed to bring photo id and insurance card day of surgery Patient aware to have Driver (ride ) / caregiver   Spouse kabirou will drop pt off  for 24 hours after surgery  Patient Special Instructions -----none Pre-Op special Istructions -----surgery orders requested with Jarrett Soho Patient verbalized understanding of instructions that were given at this phone interview. Patient denies shortness of breath, chest pain, fever, cough at this phone interview.

## 2020-05-12 ENCOUNTER — Other Ambulatory Visit (HOSPITAL_COMMUNITY)
Admission: RE | Admit: 2020-05-12 | Discharge: 2020-05-12 | Disposition: A | Discharge status: Home / Self Care | Payer: BLUE CROSS/BLUE SHIELD | Source: Ambulatory Visit | Attending: Obstetrics and Gynecology | Admitting: Obstetrics and Gynecology

## 2020-05-12 DIAGNOSIS — Z8249 Family history of ischemic heart disease and other diseases of the circulatory system: Secondary | ICD-10-CM | POA: Diagnosis not present

## 2020-05-12 DIAGNOSIS — Z01812 Encounter for preprocedural laboratory examination: Secondary | ICD-10-CM | POA: Insufficient documentation

## 2020-05-12 DIAGNOSIS — D270 Benign neoplasm of right ovary: Secondary | ICD-10-CM | POA: Diagnosis not present

## 2020-05-12 DIAGNOSIS — Z20822 Contact with and (suspected) exposure to covid-19: Secondary | ICD-10-CM | POA: Diagnosis not present

## 2020-05-13 LAB — SARS CORONAVIRUS 2 (TAT 6-24 HRS): SARS Coronavirus 2: NEGATIVE

## 2020-05-14 ENCOUNTER — Ambulatory Visit (HOSPITAL_BASED_OUTPATIENT_CLINIC_OR_DEPARTMENT_OTHER): Payer: BLUE CROSS/BLUE SHIELD | Admitting: Anesthesiology

## 2020-05-14 ENCOUNTER — Encounter (HOSPITAL_BASED_OUTPATIENT_CLINIC_OR_DEPARTMENT_OTHER): Payer: Self-pay | Admitting: Obstetrics and Gynecology

## 2020-05-14 ENCOUNTER — Ambulatory Visit (HOSPITAL_BASED_OUTPATIENT_CLINIC_OR_DEPARTMENT_OTHER)
Admission: RE | Admit: 2020-05-14 | Discharge: 2020-05-14 | Disposition: A | Discharge status: Home / Self Care | Payer: BLUE CROSS/BLUE SHIELD | Attending: Obstetrics and Gynecology | Admitting: Obstetrics and Gynecology

## 2020-05-14 ENCOUNTER — Encounter (HOSPITAL_BASED_OUTPATIENT_CLINIC_OR_DEPARTMENT_OTHER)
Admission: RE | Disposition: A | Discharge status: Home / Self Care | Payer: Self-pay | Source: Home / Self Care | Attending: Obstetrics and Gynecology

## 2020-05-14 DIAGNOSIS — Z9889 Other specified postprocedural states: Secondary | ICD-10-CM

## 2020-05-14 DIAGNOSIS — Z8742 Personal history of other diseases of the female genital tract: Secondary | ICD-10-CM

## 2020-05-14 DIAGNOSIS — Z90721 Acquired absence of ovaries, unilateral: Secondary | ICD-10-CM

## 2020-05-14 DIAGNOSIS — D270 Benign neoplasm of right ovary: Secondary | ICD-10-CM | POA: Diagnosis not present

## 2020-05-14 DIAGNOSIS — Z20822 Contact with and (suspected) exposure to covid-19: Secondary | ICD-10-CM | POA: Insufficient documentation

## 2020-05-14 DIAGNOSIS — Z8249 Family history of ischemic heart disease and other diseases of the circulatory system: Secondary | ICD-10-CM | POA: Insufficient documentation

## 2020-05-14 HISTORY — DX: Acne, unspecified: L70.9

## 2020-05-14 HISTORY — PX: LAPAROSCOPIC OVARIAN CYSTECTOMY: SHX6248

## 2020-05-14 HISTORY — PX: LAPAROSCOPIC UNILATERAL SALPINGO OOPHERECTOMY: SHX5935

## 2020-05-14 LAB — TYPE AND SCREEN
ABO/RH(D): B POS
Antibody Screen: NEGATIVE

## 2020-05-14 LAB — POCT PREGNANCY, URINE: Preg Test, Ur: NEGATIVE

## 2020-05-14 SURGERY — EXCISION, CYST, OVARY, LAPAROSCOPIC
Anesthesia: General | Site: Abdomen

## 2020-05-14 MED ORDER — DEXAMETHASONE SODIUM PHOSPHATE 4 MG/ML IJ SOLN
INTRAMUSCULAR | Status: DC | PRN
  Administered 2020-05-14: 10 mg via INTRAVENOUS

## 2020-05-14 MED ORDER — PROMETHAZINE HCL 25 MG/ML IJ SOLN
6.2500 mg | INTRAMUSCULAR | Status: DC | PRN
Start: 2020-05-14 — End: 2020-05-14

## 2020-05-14 MED ORDER — LACTATED RINGERS IV SOLN: INTRAVENOUS | Status: DC

## 2020-05-14 MED ORDER — SCOPOLAMINE 1 MG/3DAYS TD PT72
MEDICATED_PATCH | TRANSDERMAL | Status: AC
  Filled 2020-05-14: qty 1

## 2020-05-14 MED ORDER — ONDANSETRON HCL 4 MG/2ML IJ SOLN
INTRAMUSCULAR | Status: AC
  Filled 2020-05-14: qty 2

## 2020-05-14 MED ORDER — ONDANSETRON HCL 4 MG/2ML IJ SOLN
INTRAMUSCULAR | Status: DC | PRN
  Administered 2020-05-14: 4 mg via INTRAVENOUS

## 2020-05-14 MED ORDER — LIDOCAINE 2% (20 MG/ML) 5 ML SYRINGE
INTRAMUSCULAR | Status: AC
  Filled 2020-05-14: qty 5

## 2020-05-14 MED ORDER — 0.9 % SODIUM CHLORIDE (POUR BTL) OPTIME
TOPICAL | Status: DC | PRN
  Administered 2020-05-14: 500 mL

## 2020-05-14 MED ORDER — SUGAMMADEX SODIUM 200 MG/2ML IV SOLN
INTRAVENOUS | Status: DC | PRN
  Administered 2020-05-14: 200 mg via INTRAVENOUS

## 2020-05-14 MED ORDER — ROCURONIUM BROMIDE 10 MG/ML (PF) SYRINGE
PREFILLED_SYRINGE | INTRAVENOUS | Status: AC
  Filled 2020-05-14: qty 10

## 2020-05-14 MED ORDER — FENTANYL CITRATE (PF) 100 MCG/2ML IJ SOLN
INTRAMUSCULAR | Status: AC
  Filled 2020-05-14: qty 2

## 2020-05-14 MED ORDER — OXYCODONE HCL 5 MG PO TABS
ORAL_TABLET | ORAL | Status: AC
  Filled 2020-05-14: qty 1

## 2020-05-14 MED ORDER — OXYCODONE-ACETAMINOPHEN 5-325 MG PO TABS: 1.0000 | ORAL_TABLET | ORAL | 0 refills | Status: AC | PRN

## 2020-05-14 MED ORDER — FENTANYL CITRATE (PF) 100 MCG/2ML IJ SOLN
INTRAMUSCULAR | Status: DC | PRN
  Administered 2020-05-14: 50 ug via INTRAVENOUS
  Administered 2020-05-14: 100 ug via INTRAVENOUS
  Administered 2020-05-14: 50 ug via INTRAVENOUS

## 2020-05-14 MED ORDER — PROPOFOL 10 MG/ML IV BOLUS
INTRAVENOUS | Status: AC
  Filled 2020-05-14: qty 40

## 2020-05-14 MED ORDER — SODIUM CHLORIDE 0.9 % IR SOLN
Status: DC | PRN
  Administered 2020-05-14: 3000 mL

## 2020-05-14 MED ORDER — OXYCODONE HCL 5 MG PO TABS
5.0000 mg | ORAL_TABLET | Freq: Once | ORAL | Status: AC | PRN
  Administered 2020-05-14: 5 mg via ORAL

## 2020-05-14 MED ORDER — ROCURONIUM BROMIDE 100 MG/10ML IV SOLN
INTRAVENOUS | Status: DC | PRN
  Administered 2020-05-14: 60 mg via INTRAVENOUS

## 2020-05-14 MED ORDER — KETOROLAC TROMETHAMINE 30 MG/ML IJ SOLN
INTRAMUSCULAR | Status: DC | PRN
  Administered 2020-05-14: 30 mg via INTRAVENOUS

## 2020-05-14 MED ORDER — FENTANYL CITRATE (PF) 100 MCG/2ML IJ SOLN: 25.0000 ug | INTRAMUSCULAR | Status: DC | PRN

## 2020-05-14 MED ORDER — OXYCODONE HCL 5 MG/5ML PO SOLN: 5.0000 mg | Freq: Once | ORAL | Status: AC | PRN

## 2020-05-14 MED ORDER — KETOROLAC TROMETHAMINE 30 MG/ML IJ SOLN: 30.0000 mg | Freq: Once | INTRAMUSCULAR | Status: DC

## 2020-05-14 MED ORDER — ACETAMINOPHEN 160 MG/5ML PO SOLN
325.0000 mg | ORAL | Status: DC | PRN
Start: 2020-05-14 — End: 2020-05-14

## 2020-05-14 MED ORDER — IBUPROFEN 600 MG PO TABS: 600.0000 mg | ORAL_TABLET | Freq: Four times a day (QID) | ORAL | 1 refills | Status: DC | PRN

## 2020-05-14 MED ORDER — ACETAMINOPHEN 500 MG PO TABS
1000.0000 mg | ORAL_TABLET | ORAL | Status: AC
  Administered 2020-05-14: 1000 mg via ORAL

## 2020-05-14 MED ORDER — POVIDONE-IODINE 10 % EX SWAB: 2.0000 "application " | Freq: Once | CUTANEOUS | Status: DC

## 2020-05-14 MED ORDER — LIDOCAINE HCL (CARDIAC) PF 100 MG/5ML IV SOSY
PREFILLED_SYRINGE | INTRAVENOUS | Status: DC | PRN
  Administered 2020-05-14: 60 mg via INTRAVENOUS

## 2020-05-14 MED ORDER — BUPIVACAINE HCL (PF) 0.25 % IJ SOLN
INTRAMUSCULAR | Status: DC | PRN
  Administered 2020-05-14: 30 mL

## 2020-05-14 MED ORDER — ACETAMINOPHEN 10 MG/ML IV SOLN: 1000.0000 mg | Freq: Once | INTRAVENOUS | Status: DC | PRN

## 2020-05-14 MED ORDER — MIDAZOLAM HCL 2 MG/2ML IJ SOLN
INTRAMUSCULAR | Status: AC
  Filled 2020-05-14: qty 2

## 2020-05-14 MED ORDER — ACETAMINOPHEN 500 MG PO TABS
ORAL_TABLET | ORAL | Status: AC
  Filled 2020-05-14: qty 2

## 2020-05-14 MED ORDER — ACETAMINOPHEN 325 MG PO TABS
325.0000 mg | ORAL_TABLET | ORAL | Status: DC | PRN
Start: 2020-05-14 — End: 2020-05-14

## 2020-05-14 MED ORDER — PROPOFOL 10 MG/ML IV BOLUS
INTRAVENOUS | Status: DC | PRN
  Administered 2020-05-14: 150 mg via INTRAVENOUS

## 2020-05-14 MED ORDER — MIDAZOLAM HCL 5 MG/5ML IJ SOLN
INTRAMUSCULAR | Status: DC | PRN
  Administered 2020-05-14: 2 mg via INTRAVENOUS

## 2020-05-14 MED ORDER — DEXAMETHASONE SODIUM PHOSPHATE 10 MG/ML IJ SOLN
INTRAMUSCULAR | Status: AC
  Filled 2020-05-14: qty 1

## 2020-05-14 MED ORDER — SIMETHICONE 80 MG PO CHEW: 80.0000 mg | CHEWABLE_TABLET | Freq: Four times a day (QID) | ORAL | 0 refills | Status: DC | PRN

## 2020-05-14 MED ORDER — SCOPOLAMINE 1 MG/3DAYS TD PT72
1.0000 | MEDICATED_PATCH | TRANSDERMAL | Status: DC
  Administered 2020-05-14: 1.5 mg via TRANSDERMAL

## 2020-05-14 SURGICAL SUPPLY — 35 items
CABLE HIGH FREQUENCY MONO STRZ (ELECTRODE) IMPLANT
CATH ROBINSON RED A/P 16FR (CATHETERS) IMPLANT
COVER MAYO STAND STRL (DRAPES) ×3 IMPLANT
COVER WAND RF STERILE (DRAPES) ×3 IMPLANT
DECANTER SPIKE VIAL GLASS SM (MISCELLANEOUS) IMPLANT
DERMABOND ADVANCED (GAUZE/BANDAGES/DRESSINGS) ×1
DERMABOND ADVANCED .7 DNX12 (GAUZE/BANDAGES/DRESSINGS) ×2 IMPLANT
DRSG OPSITE POSTOP 3X4 (GAUZE/BANDAGES/DRESSINGS) IMPLANT
DURAPREP 26ML APPLICATOR (WOUND CARE) ×3 IMPLANT
GAUZE 4X4 16PLY RFD (DISPOSABLE) ×6 IMPLANT
GLOVE SURG ENC MOIS LTX SZ6.5 (GLOVE) ×6 IMPLANT
GOWN STRL REUS W/TWL LRG LVL3 (GOWN DISPOSABLE) ×9 IMPLANT
KIT TURNOVER CYSTO (KITS) ×3 IMPLANT
MANIPULATOR UTERINE 7CM CLEARV (MISCELLANEOUS) ×3 IMPLANT
NEEDLE SPNL 18GX3.5 QUINCKE PK (NEEDLE) ×3 IMPLANT
NS IRRIG 1000ML POUR BTL (IV SOLUTION) ×3 IMPLANT
PACK LAPAROSCOPY BASIN (CUSTOM PROCEDURE TRAY) ×3 IMPLANT
PACK TRENDGUARD 450 HYBRID PRO (MISCELLANEOUS) ×2 IMPLANT
POUCH SPECIMEN RETRIEVAL 10MM (ENDOMECHANICALS) ×3 IMPLANT
PROTECTOR NERVE ULNAR (MISCELLANEOUS) ×3 IMPLANT
SET SUCTION IRRIG HYDROSURG (IRRIGATION / IRRIGATOR) ×3 IMPLANT
SET TUBE SMOKE EVAC HIGH FLOW (TUBING) ×3 IMPLANT
SHEARS HARMONIC ACE PLUS 36CM (ENDOMECHANICALS) ×3 IMPLANT
SLEEVE XCEL OPT CAN 5 100 (ENDOMECHANICALS) ×3 IMPLANT
SOLUTION ELECTROLUBE (MISCELLANEOUS) IMPLANT
SUT VIC AB 3-0 PS2 18 (SUTURE) ×1
SUT VIC AB 3-0 PS2 18XBRD (SUTURE) ×2 IMPLANT
SUT VICRYL 0 UR6 27IN ABS (SUTURE) ×3 IMPLANT
SYR 50ML LL SCALE MARK (SYRINGE) ×3 IMPLANT
SYSTEM CARTER THOMASON II (TROCAR) ×3 IMPLANT
TOWEL OR 17X26 10 PK STRL BLUE (TOWEL DISPOSABLE) ×6 IMPLANT
TRENDGUARD 450 HYBRID PRO PACK (MISCELLANEOUS) ×3
TROCAR BLADELESS OPT 5 100 (ENDOMECHANICALS) ×6 IMPLANT
TROCAR XCEL NON-BLD 11X100MML (ENDOMECHANICALS) ×3 IMPLANT
WARMER LAPAROSCOPE (MISCELLANEOUS) ×3 IMPLANT

## 2020-05-14 NOTE — Transfer of Care (Signed)
Immediate Anesthesia Transfer of Care Note  Patient: Shelley Bishop  Procedure(s) Performed: Procedure(s) (LRB): LAPAROSCOPIC OVARIAN CYSTECTOMY, DERMOID CYSTS (Bilateral) LAPAROSCOPIC UNILATERAL SALPINGO OOPHORECTOMY (N/A)  Patient Location: PACU  Anesthesia Type: General  Level of Consciousness: awake, sedated, patient cooperative and responds to stimulation  Airway & Oxygen Therapy: Patient Spontanous Breathing and Patient connected to [place] 02 and soft FM  Post-op Assessment: Report given to PACU RN, Post -op Vital signs reviewed and stable and Patient moving all extremities  Post vital signs: Reviewed and stable  Complications: No apparent anesthesia complications

## 2020-05-14 NOTE — Anesthesia Preprocedure Evaluation (Signed)
Anesthesia Evaluation  Patient identified by MRN, date of birth, ID band Patient awake    Reviewed: Allergy & Precautions, NPO status , Patient's Chart, lab work & pertinent test results  Airway Mallampati: III  TM Distance: >3 FB Neck ROM: Full    Dental no notable dental hx. (+) Dental Advisory Given   Pulmonary neg pulmonary ROS,    Pulmonary exam normal        Cardiovascular negative cardio ROS Normal cardiovascular exam     Neuro/Psych negative neurological ROS     GI/Hepatic negative GI ROS, Neg liver ROS,   Endo/Other  negative endocrine ROS  Renal/GU negative Renal ROS     Musculoskeletal negative musculoskeletal ROS (+)   Abdominal Normal abdominal exam  (+)   Peds  Hematology   Anesthesia Other Findings   Reproductive/Obstetrics                            Anesthesia Physical Anesthesia Plan  ASA: II  Anesthesia Plan: General   Post-op Pain Management:    Induction: Intravenous  PONV Risk Score and Plan: 4 or greater and Ondansetron, Dexamethasone, Midazolam and Scopolamine patch - Pre-op  Airway Management Planned: Oral ETT  Additional Equipment: None  Intra-op Plan:   Post-operative Plan: Extubation in OR  Informed Consent: I have reviewed the patients History and Physical, chart, labs and discussed the procedure including the risks, benefits and alternatives for the proposed anesthesia with the patient or authorized representative who has indicated his/her understanding and acceptance.     Dental advisory given  Plan Discussed with: CRNA  Anesthesia Plan Comments:        Anesthesia Quick Evaluation

## 2020-05-14 NOTE — Discharge Instructions (Signed)

## 2020-05-14 NOTE — Anesthesia Postprocedure Evaluation (Signed)
Anesthesia Post Note  Patient: Shelley Bishop  Procedure(s) Performed: LAPAROSCOPIC OVARIAN CYSTECTOMY, DERMOID CYSTS (Bilateral Abdomen) LAPAROSCOPIC UNILATERAL SALPINGO OOPHORECTOMY (N/A Abdomen)     Patient location during evaluation: PACU Anesthesia Type: General Level of consciousness: awake and alert Pain management: pain level controlled Vital Signs Assessment: post-procedure vital signs reviewed and stable Respiratory status: spontaneous breathing, nonlabored ventilation, respiratory function stable and patient connected to nasal cannula oxygen Cardiovascular status: blood pressure returned to baseline and stable Postop Assessment: no apparent nausea or vomiting Anesthetic complications: no   No complications documented.  Last Vitals:  Vitals:   05/14/20 1040 05/14/20 1120  BP: 113/73 112/72  Pulse: 76 77  Resp: 15 12  Temp:  (!) 36.1 C  SpO2: 98% 100%    Last Pain:  Vitals:   05/14/20 1210  TempSrc:   PainSc: 0-No pain                 Effie Berkshire

## 2020-05-14 NOTE — Anesthesia Procedure Notes (Signed)
Procedure Name: Intubation Date/Time: 05/14/2020 8:42 AM Performed by: Justice Rocher, CRNA Pre-anesthesia Checklist: Patient identified, Emergency Drugs available, Suction available, Patient being monitored and Timeout performed Patient Re-evaluated:Patient Re-evaluated prior to induction Oxygen Delivery Method: Circle system utilized Preoxygenation: Pre-oxygenation with 100% oxygen Induction Type: IV induction Ventilation: Mask ventilation without difficulty Laryngoscope Size: Mac and 3 Grade View: Grade II Tube type: Oral Tube size: 7.0 mm Number of attempts: 1 Airway Equipment and Method: Stylet and Oral airway Placement Confirmation: ETT inserted through vocal cords under direct vision,  positive ETCO2,  breath sounds checked- equal and bilateral and CO2 detector Secured at: 22 cm Tube secured with: Tape Dental Injury: Teeth and Oropharynx as per pre-operative assessment

## 2020-05-14 NOTE — Interval H&P Note (Signed)
History and Physical Interval Note: No change since H/P done  Consent confirmed To OR when ready   05/14/2020 8:21 AM  Shelley Bishop  has presented today for surgery, with the diagnosis of dermoid cysts.  The various methods of treatment have been discussed with the patient and family. After consideration of risks, benefits and other options for treatment, the patient has consented to  Procedure(s): LAPAROSCOPIC OVARIAN CYSTECTOMY, DERMOID CYSTS (Bilateral) LAPAROSCOPIC UNILATERAL SALPINGO OOPHORECTOMY (N/A) as a surgical intervention.  The patient's history has been reviewed, patient examined, no change in status, stable for surgery.  I have reviewed the patient's chart and labs.  Questions were answered to the patient's satisfaction.     Isaiah Serge

## 2020-05-14 NOTE — H&P (Signed)
Shelley Bishop is an 36 [id number] female here for scheduled laparoscopic removal of suspected dermoid cysts; possible unilateral oopherectomy.  Pt reports no pain or discomfort. Dermoids were first noted during her pregnancy. Pt deferred surgery initially for religious reasons.  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    Past Medical History:  Diagnosis Date  . Acne   . Anemia 2020   during pregnancy   . IUGR (intrauterine growth restriction)   . Medical history non-contributory   . Precipitous delivery 06/07/2018    Past Surgical History:  Procedure Laterality Date  . NO PAST SURGERIES      Family History  Problem Relation Age of Onset  . Hypertension Mother     Social History:  reports that she has never smoked. She has never used smokeless tobacco. She reports that she does not drink alcohol and does not use drugs.  Allergies: No Known Allergies  No medications prior to admission.    Review of Systems  Constitutional: Negative for activity change, appetite change and fatigue.  Eyes: Negative for photophobia.  Respiratory: Negative for chest tightness and shortness of breath.   Cardiovascular: Negative for chest pain, palpitations and leg swelling.  Gastrointestinal: Negative for abdominal pain, anal bleeding, blood in stool, constipation, diarrhea and nausea.  Genitourinary: Negative for flank pain, menstrual problem and pelvic pain.   Neurological: Negative for light-headedness.  Psychiatric/Behavioral: The patient is nervous/anxious.     Height 5\' 4"  (1.626 m), weight 69.9 kg, last menstrual period 05/03/2020, unknown if currently breastfeeding. Physical Exam Vitals and nursing note reviewed. Exam conducted with a chaperone present.  Constitutional:      Appearance: Normal appearance. She is normal weight.  Cardiovascular:     Pulses: Normal pulses.  Abdominal:     General: Abdomen is flat.  Genitourinary:    General: Normal vulva.  Musculoskeletal:        General: Normal range of motion.     Cervical back: Normal range of motion.  Skin:    General: Skin is warm.     Capillary Refill: Capillary refill takes 2 to 3 seconds.  Neurological:     General: No focal deficit present.     Mental Status: She is alert and oriented to person, place, and time. Mental status is at baseline.  Psychiatric:        Mood and Affect: Mood normal.        Behavior: Behavior normal.        Thought Content: Thought content normal.        Judgment: Judgment normal.     No results found for this or any previous visit (from the past 24 hour(s)).  No results found.  Assessment/Plan: 30yo G3P3003 female here for laparoscopic removal of suspected dermoid cysts; possible unilateral oopherectomy - Admit - ERAS protocol  - Covid screen neg - Verify consent to procedure - To OR when ready  [name] 05/14/2020, 2:39 AM

## 2020-05-15 LAB — SURGICAL PATHOLOGY

## 2020-05-16 ENCOUNTER — Encounter (HOSPITAL_BASED_OUTPATIENT_CLINIC_OR_DEPARTMENT_OTHER): Payer: Self-pay | Admitting: Obstetrics and Gynecology

## 2020-05-19 DIAGNOSIS — Z90721 Acquired absence of ovaries, unilateral: Secondary | ICD-10-CM

## 2020-05-19 DIAGNOSIS — Z9079 Acquired absence of other genital organ(s): Secondary | ICD-10-CM

## 2020-05-19 DIAGNOSIS — Z8742 Personal history of other diseases of the female genital tract: Secondary | ICD-10-CM

## 2020-05-19 NOTE — Op Note (Signed)
Operative Note    Preoperative Diagnosis: 1. Bilateral persistent dermoid cyst   Postoperative Diagnosis: 1. Small left dermoid cyst                                             2. Large 6cm right ovarian dermoid cyst    Procedure: Laparoscopic right salpingo-oopherectomy and left dermoid                       cystectomy   Surgeon: Mickle Mallory DO  Assist: Meisinger, T MD  Anesthesia: General   Fluids: LR 941ml EBL: <93ml UOP: voided prior to OR    Findings: Large right ovarian dermoid cyst containing fatty fluid and hair and encompassing the entire right ovary and measuring approximately 6-7cm; a small left dermoid cyst approximately 2cm in sixe    Specimen: Right ovarian cyst, ovary and fallopian tube    Procedure Note Consent reviewed and confirmed in periop area. Patient was taken to the operating room where general anesthesia was administered  without difficulty. She was prepped and draped in the normal sterile fashion in the dorsal lithotomy position with her arms tucked at her sides.  An appropriate timeout was performed.  A speculum was then placed within the vagina and a Clearvue uterine manipulator placed. Pt had voided prior to entering OR.  Attention was then turned to the patient's abdomen after draping. A 5 mm infraumbilical incision was made and the optiview trocar and camera easily introduced into the abdominal cavity.Gas flow was then applied and a pneumoperitoneum obtained with approximate 3 L of CO2 gas. A large right  ovarian cyst was noted on gross evaluation.  A second 46mm trocar was placed in the patients right lower uterine segment under visualization  and a 11cm port and trocar in the left lower quadrant; a better survey was done.    With patient in Trendelenburg the uterus and tubes and ovaries were inspected. The right ovary was completely enveloped by the cyst; they appeared to be about 6-7cm in size.  The left ovary appeared to have a small 1-2 cm cyst  present.  At this time the harmonic scalpel was then used to excise the fright fallopian tube, ovary and cyst. Great hemostasis was noted. The  left ovary was inspected and a 2cm dermoid cyst effectively and hemostatically excised. Hair and fatty fluid noted.  An endocatch bag was then introduced into the abdomen and the tube/ovary/cyst placed in the bag. The fluid component was drained using a spinal needle.  The bag with the specimen was removed.   The remainder of the pelvis and abdomen were inspected and found to be normal. The left port was closed  using a carter thomasen and visualization.  All instruments were removed from the abdomen and the gas reduced with CRNA provided deep breaths.  Incisions were closed with 4-0 vicryl.  Dermabond was applied to the incisions.  Patient was then awakened and taken to the recovery room in good condition. Counts were all confirmed.

## 2020-07-04 ENCOUNTER — Ambulatory Visit: Payer: BLUE CROSS/BLUE SHIELD | Attending: Internal Medicine

## 2020-07-04 DIAGNOSIS — Z20822 Contact with and (suspected) exposure to covid-19: Secondary | ICD-10-CM

## 2020-07-06 LAB — NOVEL CORONAVIRUS, NAA: SARS-CoV-2, NAA: NOT DETECTED

## 2020-07-06 LAB — SARS-COV-2, NAA 2 DAY TAT

## 2020-07-07 ENCOUNTER — Ambulatory Visit: Payer: BLUE CROSS/BLUE SHIELD | Attending: Internal Medicine

## 2020-07-07 DIAGNOSIS — Z20822 Contact with and (suspected) exposure to covid-19: Secondary | ICD-10-CM

## 2020-07-08 LAB — NOVEL CORONAVIRUS, NAA: SARS-CoV-2, NAA: NOT DETECTED

## 2020-07-08 LAB — SPECIMEN STATUS REPORT

## 2020-07-08 LAB — SARS-COV-2, NAA 2 DAY TAT

## 2020-09-30 DIAGNOSIS — R87612 Low grade squamous intraepithelial lesion on cytologic smear of cervix (LGSIL): Secondary | ICD-10-CM | POA: Insufficient documentation

## 2021-07-19 ENCOUNTER — Ambulatory Visit (HOSPITAL_COMMUNITY)
Admission: EM | Admit: 2021-07-19 | Discharge: 2021-07-19 | Disposition: A | Discharge status: Home / Self Care | Payer: BLUE CROSS/BLUE SHIELD | Attending: Physician Assistant | Admitting: Physician Assistant

## 2021-07-19 DIAGNOSIS — H5789 Other specified disorders of eye and adnexa: Secondary | ICD-10-CM | POA: Diagnosis not present

## 2021-07-19 DIAGNOSIS — H5713 Ocular pain, bilateral: Secondary | ICD-10-CM | POA: Diagnosis not present

## 2021-07-19 MED ORDER — HYPROMELLOSE (GONIOSCOPIC) 2.5 % OP SOLN: 1.0000 [drp] | Freq: Three times a day (TID) | OPHTHALMIC | 12 refills | Status: DC

## 2021-07-19 NOTE — Discharge Instructions (Addendum)
Advised to use the artificial tears on a regular basis for the next couple days, 1 drop in each eye 3-4 times a day. Advised to avoid direct sunlight and wear sunglasses when outside for the next several days. Advised to follow-up with ophthalmology or return to urgent care if symptoms fail to improve.

## 2021-07-19 NOTE — ED Triage Notes (Signed)
Pt presents to [facility] with co of pain/ irritation in bilateral eyes since last night. Pt reports she recently used a uv light that is for disinfecting and she was around it without eye protection. Pt reports blurred vision and felling like sand is in her eyes.

## 2021-07-19 NOTE — ED Provider Notes (Signed)
[place]    CSN: [id number] Arrival date & time: 07/19/21  1010      History   Chief Complaint Chief Complaint  Patient presents with   eye irritatation    HPI Shelley Bishop is a 31 y.o. female.   31 year old female presents with bilateral eye irritation.  Patient relates that she has a UV light in the house and she had used it the first time last night, she turned it on and was around the light for about 7 to 10 minutes before she read the directions saying that she was not supposed to be around the light.  Patient indicates she was using the UV light to sterilize the house and she was supposed to turn it on when no one would be there.  Patient indicates since then she has been having some bilateral eye irritation, burning, feeling like sand is in the eyes, and some drainage.  Patient indicates that her vision has been normal although she has had some blurriness occurring intermittently.  Patient indicates she is not having any upper respiratory symptoms, no fever or chills.  Patient indicates she has been using some sterile water for the eyes which is provided a little bit of relief.     Past Medical History:  Diagnosis Date   Acne    Anemia 2020   during pregnancy    IUGR (intrauterine growth restriction)    Medical history non-contributory    Precipitous delivery 06/07/2018    Patient Active Problem List   Diagnosis Date Noted   History of right salpingo-oophorectomy 05/19/2020   Indication for care in labor or delivery 06/07/2018   Precipitous delivery 06/07/2018   Postpartum care following vaginal delivery 06/07/2018   Abnormal glucose tolerance test (GTT) during pregnancy, antepartum 04/05/2018   Active labor 06/15/2015   Supervision of high-risk pregnancy 11/20/2012   Symmetric IUGR complicating pregnancy, antepartum 11/20/2012    Past Surgical History:  Procedure Laterality Date   LAPAROSCOPIC OVARIAN CYSTECTOMY Bilateral 05/14/2020    Procedure: LAPAROSCOPIC OVARIAN CYSTECTOMY, DERMOID CYSTS;  Surgeon: Sherlyn Hay, DO;  Location: [facility];  Service: Gynecology;  Laterality: Bilateral;   LAPAROSCOPIC UNILATERAL SALPINGO OOPHERECTOMY N/A 05/14/2020   Procedure: LAPAROSCOPIC UNILATERAL SALPINGO OOPHORECTOMY;  Surgeon: Sherlyn Hay, DO;  Location: [facility];  Service: Gynecology;  Laterality: N/A;   NO PAST SURGERIES      OB History     Gravida  3   Para  3   Term  3   Preterm      AB      Living  3      SAB      IAB      Ectopic      Multiple  0   Live Births  3            Home Medications    Prior to Admission medications   Medication Sig Start Date End Date Taking? Authorizing Provider  hydroxypropyl methylcellulose / hypromellose (ISOPTO TEARS / GONIOVISC) 2.5 % ophthalmic solution Place 1 drop into both eyes 3 (three) times daily. 07/19/21  Yes Nyoka Lint, PA-C  diphenhydrAMINE (BENADRYL) 25 mg capsule Take 25 mg by mouth as needed.    [provider]  ibuprofen (ADVIL) 600 MG tablet Take 1 tablet (600 mg total) by mouth every 6 (six) hours as needed for moderate pain or cramping. 05/14/20   Sherlyn Hay, DO  PRESCRIPTION MEDICATION at bedtime. Doxycycline all  mg q day for acne    [provider]  simethicone (GAS-X) 80 MG chewable tablet Chew 1 tablet (80 mg total) by mouth every 6 (six) hours as needed for flatulence. 05/14/20   Sherlyn Hay, DO    Family History Family History  Problem Relation Age of Onset   Hypertension Mother     Social History Social History   Tobacco Use   Smoking status: Never   Smokeless tobacco: Never  Vaping Use   Vaping Use: Never used  Substance Use Topics   Alcohol use: No    Alcohol/week: 0.0 standard drinks of alcohol   Drug use: No     Allergies   Patient has no known allergies.   Review of Systems Review of Systems  Eyes:  Positive for pain (tenderness  and irritation).     Physical Exam Triage Vital Signs ED Triage Vitals  Enc Vitals Group     BP 07/19/21 1034 118/64     Pulse Rate 07/19/21 1034 75     Resp 07/19/21 1034 18     Temp 07/19/21 1034 98.9 F (37.2 C)     Temp Source 07/19/21 1034 Oral     SpO2 07/19/21 1034 100 %     Weight --      Height --      Head Circumference --      Peak Flow --      Pain Score 07/19/21 1032 4     Pain Loc --      Pain Edu? --      Excl. in [place]? --    No data found.  Updated Vital Signs BP 118/64   Pulse 75   Temp 98.9 F (37.2 C) (Oral)   Resp 18   SpO2 100%   Visual Acuity Right Eye Distance:   Left Eye Distance:   Bilateral Distance:    Right Eye Near:   Left Eye Near:    Bilateral Near:     Physical Exam Constitutional:      Appearance: Normal appearance.  Eyes:     General: Lids are normal.     Extraocular Movements: Extraocular movements intact.     Conjunctiva/sclera:     Right eye: Right conjunctiva is injected (mild without drainage).     Left eye: Left conjunctiva is injected (mild without drainage).  Neurological:     Mental Status: She is alert.      UC Treatments / Results  Labs (all labs ordered are listed, but only abnormal results are displayed) Labs Reviewed - No data to display  EKG   Radiology No results found.  Procedures Procedures (including critical care time)  Medications Ordered in UC Medications - No data to display  Initial Impression / Assessment and Plan / UC Course  I have reviewed the triage vital signs and the nursing notes.  Pertinent labs & imaging results that were available during my care of the patient were reviewed by me and considered in my medical decision making (see chart for details).    Plan: 1.  Advised to use artificial tears 1 drop each eye 3-4 times a day over the next couple days to provide relief. 2.  Advised to avoid sunlight, wear sunglasses when outside. 3.  Advised to follow-up PCP or return  to urgent care if symptoms fail to improve. Final Clinical Impressions(s) / UC Diagnoses   Final diagnoses:  Irritation of both eyes  Eye pain, bilateral     Discharge  Instructions      Advised to use the artificial tears on a regular basis for the next couple days, 1 drop in each eye 3-4 times a day. Advised to avoid direct sunlight and wear sunglasses when outside for the next several days. Advised to follow-up with ophthalmology or return to urgent care if symptoms fail to improve.    ED Prescriptions     Medication Sig Dispense Auth. Provider   hydroxypropyl methylcellulose / hypromellose (ISOPTO TEARS / GONIOVISC) 2.5 % ophthalmic solution Place 1 drop into both eyes 3 (three) times daily. 15 mL Nyoka Lint, PA-C      PDMP not reviewed this encounter.   Nyoka Lint, PA-C 07/19/21 1101

## 2021-08-22 LAB — GLUCOSE, POCT (MANUAL RESULT ENTRY): POC Glucose: 158 mg/dl — AB (ref 70–99)

## 2021-10-03 ENCOUNTER — Encounter (HOSPITAL_BASED_OUTPATIENT_CLINIC_OR_DEPARTMENT_OTHER): Payer: Self-pay | Admitting: *Deleted

## 2021-10-03 ENCOUNTER — Emergency Department (HOSPITAL_BASED_OUTPATIENT_CLINIC_OR_DEPARTMENT_OTHER): Payer: BLUE CROSS/BLUE SHIELD

## 2021-10-03 ENCOUNTER — Emergency Department (HOSPITAL_BASED_OUTPATIENT_CLINIC_OR_DEPARTMENT_OTHER)
Admission: EM | Admit: 2021-10-03 | Discharge: 2021-10-03 | Disposition: A | Discharge status: Home / Self Care | Payer: BLUE CROSS/BLUE SHIELD | Attending: Emergency Medicine | Admitting: Emergency Medicine

## 2021-10-03 DIAGNOSIS — N9489 Other specified conditions associated with female genital organs and menstrual cycle: Secondary | ICD-10-CM | POA: Insufficient documentation

## 2021-10-03 DIAGNOSIS — R1032 Left lower quadrant pain: Secondary | ICD-10-CM | POA: Insufficient documentation

## 2021-10-03 DIAGNOSIS — R10814 Left lower quadrant abdominal tenderness: Secondary | ICD-10-CM

## 2021-10-03 LAB — URINALYSIS, ROUTINE W REFLEX MICROSCOPIC
Bilirubin Urine: NEGATIVE
Glucose, UA: NEGATIVE mg/dL
Ketones, ur: NEGATIVE mg/dL
Leukocytes,Ua: NEGATIVE
Nitrite: NEGATIVE
Protein, ur: NEGATIVE mg/dL
Specific Gravity, Urine: 1.008 (ref 1.005–1.030)
pH: 6 (ref 5.0–8.0)

## 2021-10-03 LAB — COMPREHENSIVE METABOLIC PANEL
ALT: 14 U/L (ref 0–44)
AST: 13 U/L — ABNORMAL LOW (ref 15–41)
Albumin: 4.1 g/dL (ref 3.5–5.0)
Alkaline Phosphatase: 56 U/L (ref 38–126)
Anion gap: 5 (ref 5–15)
BUN: 10 mg/dL (ref 6–20)
CO2: 27 mmol/L (ref 22–32)
Calcium: 9.8 mg/dL (ref 8.9–10.3)
Chloride: 104 mmol/L (ref 98–111)
Creatinine, Ser: 0.74 mg/dL (ref 0.44–1.00)
GFR, Estimated: >60 mL/min (ref >60)
Glucose, Bld: 96 mg/dL (ref 70–99)
Potassium: 3.8 mmol/L (ref 3.5–5.1)
Sodium: 136 mmol/L (ref 135–145)
Total Bilirubin: 0.6 mg/dL (ref 0.3–1.2)
Total Protein: 7.3 g/dL (ref 6.5–8.1)

## 2021-10-03 LAB — CBC
HCT: 36.1 % (ref 36.0–46.0)
Hemoglobin: 11.7 g/dL — ABNORMAL LOW (ref 12.0–15.0)
MCH: 26.2 pg (ref 26.0–34.0)
MCHC: 32.4 g/dL (ref 30.0–36.0)
MCV: 80.9 fL (ref 80.0–100.0)
Platelets: 284 10*3/uL (ref 150–400)
RBC: 4.46 MIL/uL (ref 3.87–5.11)
RDW: 13.9 % (ref 11.5–15.5)
WBC: 8.6 10*3/uL (ref 4.0–10.5)
nRBC: 0 % (ref 0.0–0.2)

## 2021-10-03 LAB — HCG, QUANTITATIVE, PREGNANCY: hCG, Beta Chain, Quant, S: <1 m[IU]/mL (ref <5)

## 2021-10-03 LAB — LIPASE, BLOOD: Lipase: 27 U/L (ref 11–51)

## 2021-10-03 LAB — PREGNANCY, URINE: Preg Test, Ur: NEGATIVE

## 2021-10-03 MED ORDER — SODIUM CHLORIDE 0.9 % IV BOLUS
1000.0000 mL | Freq: Once | INTRAVENOUS | Status: AC
  Administered 2021-10-03: 1000 mL via INTRAVENOUS

## 2021-10-03 MED ORDER — ONDANSETRON 4 MG PO TBDP: 4.0000 mg | ORAL_TABLET | Freq: Three times a day (TID) | ORAL | 0 refills | Status: DC | PRN

## 2021-10-03 NOTE — ED Triage Notes (Signed)
Patient reports waking up this am with sharp pain in lower abdominal pain non-radiating, took ibuprofen 848m pta. Reports nausea early today. No diarrhea.

## 2021-10-03 NOTE — ED Provider Notes (Signed)
[facility] EMERGENCY DEPT Provider Note   CSN: [id number] Arrival date & time: 10/03/21  1124     History  Chief Complaint  Patient presents with   Abdominal Pain    Licet Natara Monfort is a 31 y.o. female with Hx of right salpingo-oophorectomy and bilateral ovarian cystectomies presenting today with acute lower abdominal pain.  Sudden onset this morning around 8 AM.  Described as significantly sharp with nausea, and without radiation.  Took 800 mg ibuprofen, and felt pain reduction within 30 minutes.  Pain now described as mild, nausea has subsided,, with some pain in the lower left abdomen as well.  Without dysuria, however with difficulty initiating urination,  unsure whether it's caused by her pain.  Denies urinary or bowel incontinence.  Endorses possible chills, however denies fever, diarrhea, constipation, bloody urine or bowel movements, flank pain, back pain, headache, dizziness, vaginal symptoms, chest pain, or shortness of breath.  Currently has copper IUD, placed November last year.  Last vaginal spotting 3 to 4 weeks ago.  The history is provided by the patient and medical records.  Abdominal Pain    Home Medications Prior to Admission medications   Medication Sig Start Date End Date Taking? Authorizing Provider  diphenhydrAMINE (BENADRYL) 25 mg capsule Take 25 mg by mouth as needed.    [provider]  hydroxypropyl methylcellulose / hypromellose (ISOPTO TEARS / GONIOVISC) 2.5 % ophthalmic solution Place 1 drop into both eyes 3 (three) times daily. 07/19/21   Nyoka Lint, PA-C  ibuprofen (ADVIL) 600 MG tablet Take 1 tablet (600 mg total) by mouth every 6 (six) hours as needed for moderate pain or cramping. 05/14/20   Sherlyn Hay, DO  PRESCRIPTION MEDICATION at bedtime. Doxycycline all mg q day for acne    [provider]  simethicone (GAS-X) 80 MG chewable tablet Chew 1 tablet (80 mg total) by mouth every 6 (six) hours as needed for  flatulence. 05/14/20   Sherlyn Hay, DO      Allergies    Patient has no known allergies.    Review of Systems   Review of Systems  Gastrointestinal:  Positive for abdominal pain.    Physical Exam Updated Vital Signs BP 101/62 (BP Location: Right Arm)   Pulse 81   Temp 98.7 F (37.1 C) (Oral)   Resp 17   LMP 09/07/2021   SpO2 100%  Physical Exam Vitals and nursing note reviewed. Exam conducted with a chaperone present.  Constitutional:      General: She is not in acute distress.    Appearance: She is well-developed. She is not ill-appearing, toxic-appearing or diaphoretic.  HENT:     Head: Normocephalic and atraumatic.     Mouth/Throat:     Mouth: Mucous membranes are moist.     Pharynx: Oropharynx is clear.  Eyes:     Conjunctiva/sclera: Conjunctivae normal.  Cardiovascular:     Rate and Rhythm: Normal rate and regular rhythm.     Heart sounds: Murmur heard.  Pulmonary:     Effort: Pulmonary effort is normal. No respiratory distress.     Breath sounds: Normal breath sounds. No wheezing.  Chest:     Chest wall: No tenderness.  Abdominal:     General: There is no distension.     Palpations: Abdomen is soft. There is no shifting dullness or splenomegaly.     Tenderness: There is abdominal tenderness in the suprapubic area and left lower quadrant. There is no right CVA tenderness, left  CVA tenderness or guarding. Negative signs include Murphy's sign and McBurney's sign.  Genitourinary:    Exam position: Lithotomy position.     Pubic Area: No rash.      Labia:        Right: No rash or tenderness.        Left: No rash or tenderness.      Vagina: Normal. No vaginal discharge, tenderness or bleeding.     Cervix: No cervical motion tenderness, discharge, friability or cervical bleeding.     Uterus: Tender (Mild).      Adnexa:        Right: No tenderness.         Left: Tenderness present.   Musculoskeletal:        General: No swelling.     Cervical back: Neck  supple.  Skin:    General: Skin is warm and dry.     Capillary Refill: Capillary refill takes less than 2 seconds.  Neurological:     Mental Status: She is alert and oriented to person, place, and time.  Psychiatric:        Mood and Affect: Mood normal.     ED Results / Procedures / Treatments   Labs (all labs ordered are listed, but only abnormal results are displayed) Labs Reviewed  COMPREHENSIVE METABOLIC PANEL - Abnormal; Notable for the following components:      Result Value   AST 13 (*)    All other components within normal limits  CBC - Abnormal; Notable for the following components:   Hemoglobin 11.7 (*)    All other components within normal limits  LIPASE, BLOOD  URINALYSIS, ROUTINE W REFLEX MICROSCOPIC  PREGNANCY, URINE  HCG, SERUM, QUALITATIVE    EKG None  Radiology CLINICAL DATA:  31 year old female with acute LEFT pelvic pain and  nausea today. History of bilateral dermoids with RIGHT oophorectomy.    EXAM:  TRANSABDOMINAL AND TRANSVAGINAL ULTRASOUND OF PELVIS    DOPPLER ULTRASOUND OF OVARIES    TECHNIQUE:  Both transabdominal and transvaginal ultrasound examinations of the  pelvis were performed. Transabdominal technique was performed for  global imaging of the pelvis including uterus, LEFT ovary, adnexal  regions, and pelvic cul-de-sac.    It was necessary to proceed with endovaginal exam following the  transabdominal exam to visualize the endometrium and adnexal  regions. Color and duplex Doppler ultrasound was utilized to  evaluate blood flow to the LEFT ovary.    COMPARISON:  None Available.    FINDINGS:  Uterus    Measurements: 9 x 4.2 x 4.7 cm = volume: 92 mL. No fibroids or other  mass visualized.    Endometrium    Thickness: 10 mm. No focal abnormality visualized. An IUD is noted  in the LOWER uterine segment/UPPER cervix.    Right ovary    Not visualized compatible with history of oophorectomy.    Left ovary     Measurements: 3.5 x 2.4 x 3.4 cm = volume: 15 mL.    A 1.2 x 1.1 x 1.3 cm hyperechoic mass within the LEFT ovary may  represent patient's known dermoid.    A 2.4 x 1.4 x 2 cm cystic structure containing low level echoes is  identified.    Pulsed Doppler evaluation of the LEFT ovary demonstrates normal  low-resistance arterial and venous waveforms.    Other findings    No abnormal free fluid.    IMPRESSION:  1. IUD within the LOWER uterine segment/UPPER cervix.  2. 1.3  cm hyperechoic LEFT ovarian mass, likely representing this  patient's known dermoid. If not surgically removed, ultrasound  follow-up in 1 year is recommended.  3. 2.4 cm probable LEFT ovarian corpus luteum.  4. No evidence of LEFT ovarian torsion.  5. Status post RIGHT oophorectomy.      Electronically Signed    By: Margarette Canada M.D.    On: 10/03/2021 14:30    Procedures Procedures    Medications Ordered in ED Medications  sodium chloride 0.9 % bolus 1,000 mL (1,000 mLs Intravenous New Bag/Given 10/03/21 1315)    ED Course/ Medical Decision Making/ A&P                           Medical Decision Making Amount and/or Complexity of Data Reviewed Labs: ordered. Radiology: ordered.  Risk Prescription drug management.   31 y.o. female presents to the ED for concern of Abdominal Pain   This involves an extensive number of treatment options, and is a complaint that carries with it a high risk of complications and morbidity.  The emergent differential diagnosis prior to evaluation includes, but is not limited to: UTI, ovarian torsion, ovarian cyst rupture  This is not an exhaustive differential.   Past Medical History / Co-morbidities / Social History: Hx of right salpingo-oophorectomy, bilateral cystectomy Social Determinants of Health include: None  Additional History:  None  Lab Tests: I ordered, and personally interpreted labs.  The pertinent results include:   Mild decreased Hgb 11.7,  nonspecific Lipase 27 CMP and UA unremarkable hCG quantitative   Imaging Studies: I ordered imaging studies including US pelvic series.   I independently visualized and interpreted imaging which showed IUD within the lower uterus/upper cervix, known dermoid of left ovary, and likely corpus luteum of left ovary. I agree with the radiologist interpretation.  ED Course: Pt well-appearing on exam.  Nontoxic, nonseptic appearing in NAD.  Afebrile.  Presenting to the ED with sudden onset acute suprapubic tenderness a few hours ago.  With some LLQ involvement and subjective difficulty initiating urine outflow.  Without vaginal symptoms, changes in bowel habits, fever, vomiting.  Without flank pain or back pain, low initial suspicion for pyelonephritis or obstructing renal calculi of the ureters.  No RLQ tenderness, low suspicion for appendicitis, and right ovary surgically absent.  With Hx of prior right salpingo-oophorectomy and bilateral ovarian cystectomies.  Improvement of pain with ibuprofen and resolution of nausea prior to arrival.  Still with suprapubic and mild LLQ tenderness on exam.  Abdomen soft, does not appear surgical.  Pelvic exam with mild uterine tenderness and mild to moderate left adnexal tenderness.  No blood visualized in the vaginal canal.  Last vaginal spotting with copper IUD about 3-4 weeks ago.  Sudden onset of pain with nausea suspicious for possible ovarian torsion though still considering UTI vs menstrual cramps.  Hemodynamically stable, less suspicious of PID or TOA.  Pelvic ultrasound series pending. Pelvic ultrasound imaging indicative of known left ovarian dermoid, small likely corpus luteum, and IUD sitting in the lower uterus/upper cervix.  Negative pregnancy.  UA without evidence of UTI and remaining labs unremarkable.  Uterine tenderness likely related to IUD and placement.  Pt hemodynamically stable.  Recommend close follow up with OBGYN for further discussion and  re-evaluation of IUD placement and known dermoid.  Recommend continued conservative management and zofran PRN for nausea.  Patient in NAD and in good condition at time of discharge.  Disposition: After consideration  the patient's encounter today, I do not feel today's workup suggests an emergent condition requiring admission or immediate intervention beyond what has been performed at this time.  Safe for discharge; instructed to return immediately for worsening symptoms, change in symptoms or any other concerns.  I have reviewed the patients home medicines and have made adjustments as needed.  Discussed course of treatment with the patient, whom demonstrated understanding.  Patient in agreement and has no further questions.    I discussed this case with my attending physician Dr. Ronnald Nian, who agreed with the proposed treatment course and cosigned this note including patient's presenting symptoms, physical exam, and planned diagnostics and interventions.  Attending physician stated agreement with plan or made changes to plan which were implemented.     This chart was dictated using voice recognition software.  Despite best efforts to proofread, errors can occur which can change the documentation meaning.         Final Clinical Impression(s) / ED Diagnoses Final diagnoses:  Left lower quadrant abdominal tenderness without rebound tenderness    Rx / DC Orders ED Discharge Orders          Ordered    ondansetron (ZOFRAN-ODT) 4 MG disintegrating tablet  Every 8 hours PRN        10/03/21 1451              Prince Rome, PA-C 14/43/15 [place], [place], DO 10/12/21 [id number]

## 2021-10-03 NOTE — Discharge Instructions (Addendum)
Imaging today shows that your IUD may be slightly low-set.  Also indicates an ovarian cyst, (corpus luteum), which can be a natural part of female menstrual cycle.  Please follow-up with your OB/GYN to further discuss these results within the next 2 to 3 days, and for further reevaluation and medical management.  A prescription for Zofran dissolvable tablets has also been sent to your pharmacy.  You may use 1 tablet every 6-8 hours as needed for nausea.  Continue to manage symptoms with ibuprofen and Tylenol, heating pad, and rest.  Return to the ED for any new or worsening symptoms as discussed.

## 2021-10-03 NOTE — ED Notes (Signed)
Discharge instructions, follow up care, and prescription reviewed and explained, pt verbalized understanding. Pt caox4 and ambulatory on d/c.  

## 2022-12-28 DIAGNOSIS — R87610 Atypical squamous cells of undetermined significance on cytologic smear of cervix (ASC-US): Secondary | ICD-10-CM | POA: Insufficient documentation

## 2023-08-25 NOTE — Telephone Encounter (Signed)
 Pt 6wks 4d per LMP-07/10/2023. Phone call from pt. Pt identified. Pt c/o N/V since yesterday morning. Pt unable to get foods down, but has been able to keep some fluids down. Pt admits to urinating normally. Pt has tried OTC Vit B6 and Unisom w/o relief, as Unisom makes pt sleepy. Pt informed will send message to on-call provider and req rx for Diclegis, as Phenergan  can cause sleepiness too. Pt agrees.   Pt also request to be out of work for 2wks for this N/V. Pt informed at this time, this note cannot be approved as sx's at this time does not constitute being out of work. Pt voices understanding.

## 2023-08-26 NOTE — Telephone Encounter (Signed)
 Rx Diclegis sent to pharm on file. VM left for pt to call the office to make aware

## 2023-08-26 NOTE — Telephone Encounter (Signed)
 Pt aware rx for Diclegis sent to pharm on file.

## 2023-09-04 ENCOUNTER — Encounter (HOSPITAL_BASED_OUTPATIENT_CLINIC_OR_DEPARTMENT_OTHER): Payer: Self-pay

## 2023-09-04 ENCOUNTER — Other Ambulatory Visit: Payer: Self-pay

## 2023-09-04 ENCOUNTER — Emergency Department (HOSPITAL_BASED_OUTPATIENT_CLINIC_OR_DEPARTMENT_OTHER)
Admission: EM | Admit: 2023-09-04 | Discharge: 2023-09-04 | Disposition: A | Discharge status: Home / Self Care | Attending: Emergency Medicine | Admitting: Emergency Medicine

## 2023-09-04 DIAGNOSIS — O219 Vomiting of pregnancy, unspecified: Secondary | ICD-10-CM | POA: Insufficient documentation

## 2023-09-04 DIAGNOSIS — Z3A08 8 weeks gestation of pregnancy: Secondary | ICD-10-CM | POA: Insufficient documentation

## 2023-09-04 LAB — CBC WITH DIFFERENTIAL/PLATELET
Abs Immature Granulocytes: 0.01 K/uL (ref 0.00–0.07)
Basophils Absolute: 0 K/uL (ref 0.0–0.1)
Basophils Relative: 1 %
Eosinophils Absolute: 0.2 K/uL (ref 0.0–0.5)
Eosinophils Relative: 3 %
HCT: 33.9 % — ABNORMAL LOW (ref 36.0–46.0)
Hemoglobin: 11.2 g/dL — ABNORMAL LOW (ref 12.0–15.0)
Immature Granulocytes: 0 %
Lymphocytes Relative: 32 %
Lymphs Abs: 2.3 K/uL (ref 0.7–4.0)
MCH: 27.4 pg (ref 26.0–34.0)
MCHC: 33 g/dL (ref 30.0–36.0)
MCV: 82.9 fL (ref 80.0–100.0)
Monocytes Absolute: 0.8 K/uL (ref 0.1–1.0)
Monocytes Relative: 11 %
Neutro Abs: 3.9 K/uL (ref 1.7–7.7)
Neutrophils Relative %: 53 %
Platelets: 253 K/uL (ref 150–400)
RBC: 4.09 MIL/uL (ref 3.87–5.11)
RDW: 13.7 % (ref 11.5–15.5)
WBC: 7.3 K/uL (ref 4.0–10.5)
nRBC: 0 % (ref 0.0–0.2)

## 2023-09-04 LAB — BASIC METABOLIC PANEL WITH GFR
Anion gap: 12 (ref 5–15)
BUN: 8 mg/dL (ref 6–20)
CO2: 21 mmol/L — ABNORMAL LOW (ref 22–32)
Calcium: 10 mg/dL (ref 8.9–10.3)
Chloride: 103 mmol/L (ref 98–111)
Creatinine, Ser: 0.88 mg/dL (ref 0.44–1.00)
GFR, Estimated: >60 mL/min (ref >60)
Glucose, Bld: 106 mg/dL — ABNORMAL HIGH (ref 70–99)
Potassium: 3.7 mmol/L (ref 3.5–5.1)
Sodium: 136 mmol/L (ref 135–145)

## 2023-09-04 LAB — URINALYSIS, W/ REFLEX TO CULTURE (INFECTION SUSPECTED)
Bacteria, UA: NONE SEEN
Bilirubin Urine: NEGATIVE
Glucose, UA: NEGATIVE mg/dL
Hgb urine dipstick: NEGATIVE
Ketones, ur: NEGATIVE mg/dL
Leukocytes,Ua: NEGATIVE
Nitrite: NEGATIVE
Protein, ur: NEGATIVE mg/dL
Specific Gravity, Urine: 1.017 (ref 1.005–1.030)
pH: 6.5 (ref 5.0–8.0)

## 2023-09-04 MED ORDER — METOCLOPRAMIDE HCL 10 MG PO TABS: 10.0000 mg | ORAL_TABLET | Freq: Four times a day (QID) | ORAL | 0 refills | Status: DC

## 2023-09-04 MED ORDER — DEXTROSE 5 % IV BOLUS
500.0000 mL | Freq: Once | INTRAVENOUS | Status: AC
  Administered 2023-09-04: 500 mL via INTRAVENOUS

## 2023-09-04 MED ORDER — METOCLOPRAMIDE HCL 5 MG/ML IJ SOLN
10.0000 mg | Freq: Once | INTRAMUSCULAR | Status: AC
  Administered 2023-09-04: 10 mg via INTRAVENOUS
  Filled 2023-09-04: qty 2

## 2023-09-04 MED ORDER — SODIUM CHLORIDE 0.9 % IV BOLUS
1000.0000 mL | Freq: Once | INTRAVENOUS | Status: AC
  Administered 2023-09-04: 1000 mL via INTRAVENOUS

## 2023-09-04 NOTE — ED Notes (Signed)
 Pt discharged home after verbalizing understanding of discharge instructions; nad noted.

## 2023-09-04 NOTE — ED Provider Notes (Signed)
 [place] EMERGENCY DEPARTMENT AT [facility] Provider Note  CSN: [id number] Arrival date & time: 09/04/23 1923  Chief Complaint(s) Emesis During Pregnancy  HPI Shelley Bishop is a 33 y.o. female, G4 P2 A1 here today for nausea and vomiting.  Patient [redacted] weeks pregnant by last menstrual cycle.  Has not been having any abdominal pain.  Has not yet established care with OB/GYN.   Past Medical History Past Medical History:  Diagnosis Date   Acne    Anemia 2020   during pregnancy    IUGR (intrauterine growth restriction)    Medical history non-contributory    Precipitous delivery 06/07/2018   Patient Active Problem List   Diagnosis Date Noted   History of right salpingo-oophorectomy 05/19/2020   Indication for care in labor or delivery 06/07/2018   Precipitous delivery 06/07/2018   Postpartum care following vaginal delivery 06/07/2018   Abnormal glucose tolerance test (GTT) during pregnancy, antepartum 04/05/2018   Active labor 06/15/2015   High-risk pregnancy 11/20/2012   Symmetric IUGR complicating pregnancy, antepartum 11/20/2012   Home Medication(s) Prior to Admission medications   Medication Sig Start Date End Date Taking? Authorizing Provider  metoCLOPramide  (REGLAN ) 10 MG tablet Take 1 tablet (10 mg total) by mouth every 6 (six) hours. 09/04/23  Yes Mannie Pac T, DO  diphenhydrAMINE  (BENADRYL ) 25 mg capsule Take 25 mg by mouth as needed.    [provider]  hydroxypropyl methylcellulose / hypromellose (ISOPTO TEARS / GONIOVISC) 2.5 % ophthalmic solution Place 1 drop into both eyes 3 (three) times daily. 07/19/21   Lynwood Lenis, PA-C  ibuprofen  (ADVIL ) 600 MG tablet Take 1 tablet (600 mg total) by mouth every 6 (six) hours as needed for moderate pain or cramping. 05/14/20   Banga, Ted Morrison, DO  ondansetron  (ZOFRAN -ODT) 4 MG disintegrating tablet Take 1 tablet (4 mg total) by mouth every 8 (eight) hours as needed for nausea or vomiting. 10/03/21    Cockerham, Alicia M, PA-C  PRESCRIPTION MEDICATION at bedtime. Doxycycline all mg q day for acne    [provider]  simethicone  (GAS-X) 80 MG chewable tablet Chew 1 tablet (80 mg total) by mouth every 6 (six) hours as needed for flatulence. 05/14/20   Delana Ted Morrison, DO                                                                                                                                    Past Surgical History Past Surgical History:  Procedure Laterality Date   LAPAROSCOPIC OVARIAN CYSTECTOMY Bilateral 05/14/2020   Procedure: LAPAROSCOPIC OVARIAN CYSTECTOMY, DERMOID CYSTS;  Surgeon: Delana Ted Morrison, DO;  Location: [facility];  Service: Gynecology;  Laterality: Bilateral;   LAPAROSCOPIC UNILATERAL SALPINGO OOPHERECTOMY N/A 05/14/2020   Procedure: LAPAROSCOPIC UNILATERAL SALPINGO OOPHORECTOMY;  Surgeon: Delana Ted Morrison, DO;  Location:  SURGERY CENTER;  Service: Gynecology;  Laterality: N/A;   NO PAST SURGERIES  Family History Family History  Problem Relation Age of Onset   Hypertension Mother     Social History Social History   Tobacco Use   Smoking status: Never   Smokeless tobacco: Never  Vaping Use   Vaping status: Never Used  Substance Use Topics   Alcohol use: No    Alcohol/week: 0.0 standard drinks of alcohol   Drug use: No   Allergies Patient has no known allergies.  Review of Systems Review of Systems  Physical Exam Vital Signs  I have reviewed the triage vital signs BP 115/70 (BP Location: Right Arm)   Pulse 75   Temp 97.6 F (36.4 C)   Resp 17   Ht 5' 4 (1.626 m)   Wt 71.2 kg   LMP 07/10/2023 (Exact Date)   SpO2 97%   BMI 26.95 kg/m   Physical Exam Vitals and nursing note reviewed.  Cardiovascular:     Rate and Rhythm: Normal rate.  Pulmonary:     Effort: Pulmonary effort is normal.  Abdominal:     General: Abdomen is flat. There is no distension.     Palpations: Abdomen is soft.      Tenderness: There is no abdominal tenderness. There is no guarding.  Neurological:     Mental Status: She is alert.     ED Results and Treatments Labs (all labs ordered are listed, but only abnormal results are displayed) Labs Reviewed  BASIC METABOLIC PANEL WITH GFR - Abnormal; Notable for the following components:      Result Value   CO2 21 (*)    Glucose, Bld 106 (*)    All other components within normal limits  CBC WITH DIFFERENTIAL/PLATELET - Abnormal; Notable for the following components:   Hemoglobin 11.2 (*)    HCT 33.9 (*)    All other components within normal limits  URINALYSIS, W/ REFLEX TO CULTURE (INFECTION SUSPECTED)                                                                                                                          Radiology No results found.  Pertinent labs & imaging results that were available during my care of the patient were reviewed by me and considered in my medical decision making (see MDM for details).  Medications Ordered in ED Medications  metoCLOPramide  (REGLAN ) injection 10 mg (10 mg Intravenous Given 09/04/23 2045)  sodium chloride  0.9 % bolus 1,000 mL (0 mLs Intravenous Stopped 09/04/23 2136)  dextrose  5 % bolus 500 mL (0 mLs Intravenous Stopped 09/04/23 2135)  Procedures Ultrasound ED OB Pelvic  Date/Time: 09/04/2023 8:58 PM  Performed by: Mannie Fairy DASEN, DO Authorized by: Mannie Fairy DASEN, DO   Procedure details:    Indications: evaluate for IUP     Assess:  Intrauterine pregnancy   Technique:  Transabdominal obstetric (HCG+) exam   Images: archived    Uterine findings:    Single gestation: identified     Fetal heart rate: identified      Comments:     Heart rate 171   (including critical care time)  Medical Decision Making / ED Course   This patient presents to the ED for  concern of nausea and vomiting, this involves an extensive number of treatment options, and is a complaint that carries with it a high risk of complications and morbidity.  The differential diagnosis includes early pregnancy emesis, hyperemesis gravidarum, intrauterine pregnancy, less likely ectopic pregnancy  MDM: Will check blood work in the patient, provide with antiemetics and IV fluids.  Urinalysis ordered.  Will plan to perform bedside ultrasound and see if we can identify IUP.  Reassessment 8:58 PM-patient with IUP identified.  No ketones in urine.  Reassessment 958-normal electrolytes.  Patient tolerating p.o.  Will discharge with prescription for Reglan .   Additional history obtained: -Additional history obtained from partner at bedside -External records from outside source obtained and reviewed including: Chart review including previous notes, labs, imaging, consultation notes   Lab Tests: -I ordered, reviewed, and interpreted labs.   The pertinent results include:   Labs Reviewed  BASIC METABOLIC PANEL WITH GFR - Abnormal; Notable for the following components:      Result Value   CO2 21 (*)    Glucose, Bld 106 (*)    All other components within normal limits  CBC WITH DIFFERENTIAL/PLATELET - Abnormal; Notable for the following components:   Hemoglobin 11.2 (*)    HCT 33.9 (*)    All other components within normal limits  URINALYSIS, W/ REFLEX TO CULTURE (INFECTION SUSPECTED)       Medicines ordered and prescription drug management: Meds ordered this encounter  Medications   metoCLOPramide  (REGLAN ) injection 10 mg   sodium chloride  0.9 % bolus 1,000 mL   dextrose  5 % bolus 500 mL   metoCLOPramide  (REGLAN ) 10 MG tablet    Sig: Take 1 tablet (10 mg total) by mouth every 6 (six) hours.    Dispense:  30 tablet    Refill:  0    -I have reviewed the patients home medicines and have made adjustments as needed   Cardiac Monitoring: The patient was maintained on a  cardiac monitor.  I personally viewed and interpreted the cardiac monitored which showed an underlying rhythm of: Normal sinus rhythm  Social Determinants of Health:  Factors impacting patients care include: Lack of access to primary care   Reevaluation: After the interventions noted above, I reevaluated the patient and found that they have :improved  Co morbidities that complicate the patient evaluation  Past Medical History:  Diagnosis Date   Acne    Anemia 2020   during pregnancy    IUGR (intrauterine growth restriction)    Medical history non-contributory    Precipitous delivery 06/07/2018      Dispostion: I considered admission for this patient, however she is appropriate for discharge     Final Clinical Impression(s) / ED Diagnoses Final diagnoses:  Vomiting pregnancy     @PCDICTATION @    Mannie Fairy T, DO 09/04/23 2200

## 2023-09-04 NOTE — ED Notes (Signed)
Pt given crackers and soda for PO challenge

## 2023-09-04 NOTE — ED Triage Notes (Signed)
~  [redacted] weeks pregnant. LMP 07/10/23  Says she has been having increased nausea and vomiting.   Recently started Diclegis but symptoms not improving.

## 2023-09-04 NOTE — Discharge Instructions (Addendum)
 You can take Reglan  every 6 hours for nausea.  Your blood work overall was normal.  You had an intrauterine pregnancy that appeared to have a normal heartbeat.  Follow-up with your OB/GYN at your scheduled appointment.

## 2023-09-13 ENCOUNTER — Encounter

## 2023-09-14 ENCOUNTER — Ambulatory Visit (INDEPENDENT_AMBULATORY_CARE_PROVIDER_SITE_OTHER): Admitting: *Deleted

## 2023-09-14 ENCOUNTER — Other Ambulatory Visit (INDEPENDENT_AMBULATORY_CARE_PROVIDER_SITE_OTHER): Payer: Self-pay

## 2023-09-14 VITALS — BP 107/69 | HR 79 | Wt 162.5 lb

## 2023-09-14 DIAGNOSIS — Z3480 Encounter for supervision of other normal pregnancy, unspecified trimester: Secondary | ICD-10-CM | POA: Diagnosis not present

## 2023-09-14 DIAGNOSIS — O3680X0 Pregnancy with inconclusive fetal viability, not applicable or unspecified: Secondary | ICD-10-CM

## 2023-09-14 DIAGNOSIS — Z3481 Encounter for supervision of other normal pregnancy, first trimester: Secondary | ICD-10-CM

## 2023-09-14 DIAGNOSIS — Z3A09 9 weeks gestation of pregnancy: Secondary | ICD-10-CM

## 2023-09-14 DIAGNOSIS — O219 Vomiting of pregnancy, unspecified: Secondary | ICD-10-CM

## 2023-09-14 DIAGNOSIS — Z1332 Encounter for screening for maternal depression: Secondary | ICD-10-CM | POA: Diagnosis not present

## 2023-09-14 MED ORDER — BLOOD PRESSURE KIT DEVI: 1.0000 | 0 refills | Status: AC

## 2023-09-14 MED ORDER — SCOPOLAMINE 1 MG/3DAYS TD PT72: 1.0000 | MEDICATED_PATCH | TRANSDERMAL | 1 refills | Status: DC

## 2023-09-14 NOTE — Patient Instructions (Signed)

## 2023-09-14 NOTE — Progress Notes (Signed)
 New OB Intake  I connected with Ricquel Moussa Adamou  on 09/14/23 at  8:15 AM EDT by In Person Visit and verified that I am speaking with the correct person using two identifiers. Nurse is located at [facility] and pt is located at [place].  I discussed the limitations, risks, security and privacy concerns of performing an evaluation and management service by telephone and the availability of in person appointments. I also discussed with the patient that there may be a patient responsible charge related to this service. The patient expressed understanding and agreed to proceed.  I explained I am completing New OB Intake today. We discussed EDD of 04/15/24 based on LMP of 07/10/23. Pt is [id number]. I reviewed her allergies, medications and Medical/Surgical/OB history.    Patient Active Problem List   Diagnosis Date Noted   Supervision of other normal pregnancy, antepartum 09/14/2023   History of right salpingo-oophorectomy 05/19/2020   Indication for care in labor or delivery 06/07/2018   Precipitous delivery 06/07/2018   Postpartum care following vaginal delivery 06/07/2018   Abnormal glucose tolerance test (GTT) during pregnancy, antepartum 04/05/2018   Active labor 06/15/2015   High-risk pregnancy 11/20/2012   Symmetric IUGR complicating pregnancy, antepartum 11/20/2012     Concerns addressed today  Delivery Plans Plans to deliver at [facility]. Discussed the nature of our practice with multiple providers including residents and students as well as female and female providers. Due to the size of the practice, the delivering provider may not be the same as those providing prenatal care.   Patient is interested in water birth.  MyChart/Babyscripts MyChart access verified. I explained pt will have some visits in office and some virtually. Babyscripts instructions given and order placed. Patient verifies receipt of registration text/e-mail. Account successfully created and app downloaded. If patient  is a candidate for Optimized scheduling, add to sticky note.   Blood Pressure Cuff/Weight Scale Blood pressure cuff ordered for patient to pick-up from [employer]. Explained after first prenatal appt pt will check weekly and document in Babyscripts. Patient does not have weight scale; patient may purchase if they desire to track weight weekly in Babyscripts.  Anatomy US  Explained first scheduled US  will be around 19 weeks. Anatomy US  scheduled for TBD at TBD.  Is patient a candidate for Babyscripts Optimization? No, due to Risk Factors   First visit review I reviewed new OB appt with patient. Explained pt will be seen by Jorene Moats, PA at first visit. Discussed [place] genetic screening with patient. Requests Panorama and Horizon.. Routine prenatal labs OB Urine collected at today's visit. Initial OB labs deferred to New OB appt.   Last Pap No results found for: DIAGPAP  Rocky CHRISTELLA Ober, RN 09/14/2023  9:02 AM

## 2023-09-20 LAB — CULTURE, OB URINE

## 2023-09-20 LAB — URINE CULTURE, OB REFLEX

## 2023-10-05 ENCOUNTER — Encounter: Payer: Self-pay | Admitting: Physician Assistant

## 2023-10-05 ENCOUNTER — Other Ambulatory Visit (HOSPITAL_COMMUNITY)
Admission: RE | Admit: 2023-10-05 | Discharge: 2023-10-05 | Disposition: A | Discharge status: Home / Self Care | Source: Ambulatory Visit | Attending: Physician Assistant | Admitting: Physician Assistant

## 2023-10-05 ENCOUNTER — Ambulatory Visit (INDEPENDENT_AMBULATORY_CARE_PROVIDER_SITE_OTHER): Admitting: Physician Assistant

## 2023-10-05 VITALS — BP 99/66 | HR 81 | Wt 164.2 lb

## 2023-10-05 DIAGNOSIS — Z8632 Personal history of gestational diabetes: Secondary | ICD-10-CM | POA: Diagnosis not present

## 2023-10-05 DIAGNOSIS — O09293 Supervision of pregnancy with other poor reproductive or obstetric history, third trimester: Secondary | ICD-10-CM

## 2023-10-05 DIAGNOSIS — Z3480 Encounter for supervision of other normal pregnancy, unspecified trimester: Secondary | ICD-10-CM | POA: Diagnosis present

## 2023-10-05 DIAGNOSIS — Z1331 Encounter for screening for depression: Secondary | ICD-10-CM | POA: Diagnosis not present

## 2023-10-05 DIAGNOSIS — Z3A12 12 weeks gestation of pregnancy: Secondary | ICD-10-CM

## 2023-10-05 DIAGNOSIS — O09299 Supervision of pregnancy with other poor reproductive or obstetric history, unspecified trimester: Secondary | ICD-10-CM

## 2023-10-05 MED ORDER — ASPIRIN 81 MG PO TBEC: 81.0000 mg | DELAYED_RELEASE_TABLET | Freq: Every day | ORAL | 12 refills | Status: AC

## 2023-10-05 NOTE — Patient Instructions (Signed)
 Safe Over-The-Counter Medications in Pregnancy   Acne:  Benzoyl Peroxide  Salicylic Acid   Backache/Headache:  Tylenol : 2 regular strength every 4 hours OR               2 Extra strength every 6 hours   Colds/Coughs/Allergies:  Benadryl  (alcohol free) 25 mg every 6 hours as needed  Breath right strips  Claritin  Cepacol throat lozenges  Chloraseptic throat spray  Cold-Eeze- up to three times per day  Cough drops, alcohol free  Flonase (by prescription only)  Guaifenesin  Mucinex  Robitussin DM (plain only, alcohol free)  Saline nasal spray/drops  Sudafed (pseudoephedrine) & Actifed * use only after [redacted] weeks gestation and if you do not have high blood pressure  Tylenol   Vicks Vaporub  Zinc lozenges  Zyrtec   Constipation:  Colace  Ducolax suppositories  Fleet enema  Glycerin  suppositories  Metamucil  Milk of magnesia  Miralax  Senokot  Smooth move tea   Diarrhea:  Kaopectate  Imodium A-D   *NO pepto Bismol*  Hemorrhoids:  Anusol  Anusol HC  Preparation H  Tucks   Indigestion:  Tums  Maalox  Mylanta  Zantac  Pepcid   Insomnia:  Benadryl  (alcohol free) 25mg  every 6 hours as needed  Tylenol  PM  Unisom, no Gelcaps   Leg Cramps:  Tums  MagGel   Nausea/Vomiting:  Bonine  Dramamine  Emetrol  Ginger extract  Sea bands  Meclizine  Nausea medication to take during pregnancy:  Unisom (doxylamine succinate 25 mg tablets) Take one tablet daily at bedtime. If symptoms are not adequately controlled, the dose can be increased to a maximum recommended dose of two tablets daily (1/2 tablet in the morning, 1/2 tablet mid-afternoon and one at bedtime).  Vitamin B6 100mg  tablets. Take one tablet twice a day (up to 200 mg per day).   Skin Rashes:  Aveeno products  Benadryl  cream or 25mg  every 6 hours as needed  Calamine Lotion  1% cortisone cream   Yeast infection:  Gyne-lotrimin 7  Monistat 7   **If taking multiple medications, please check labels to  avoid duplicating the same active ingredients  **take medication as directed on the label  ** Do not exceed 4000 mg of tylenol  in 24 hours  **Do not take medications that contain aspirin or ibuprofen      While you are pregnant, there are some foods you should not eat or eat only in small amounts:  Certain types of cooked fish--While you're pregnant, do not eat bigeye tuna, king mackerel, marlin, orange roughy, shark, swordfish, or tilefish. Limit white (albacore) tuna to only 6 ounces a week. These fish may have high levels of mercury, which can be harmful during pregnancy. All other types of cooked fish are safe and healthy for you and your pregnancy. Try to eat at least two servings of fish or shellfish per week.  Caffeine --Caffeine  is found in coffee, tea, chocolate, energy drinks, and soft drinks. It's a good idea to limit your daily intake of caffeine  to less than 200 milligrams, which is the amount in one 12-ounce cup of coffee. Limiting caffeine  can help with nausea and sleep problems.  Sushi--Raw fish may be harmful during pregnancy. Cooked sushi is fine.  Unpasteurized milk and cheese--These foods can cause a disease called listeriosis. Avoid cheeses that are made with raw milk (such as some feta, queso fresco, and blue cheeses). Hot dogs and lunch meats can also cause this disease, although it's rare. To be on  the safe side, only eat hot dogs and lunch meats that have been heated until steaming hot.

## 2023-10-05 NOTE — Progress Notes (Signed)
 PRENATAL VISIT NOTE  Subjective:  Shelley Bishop is a 33 y.o. H4E6986 at [redacted]w[redacted]d being seen today for her first prenatal visit for this pregnancy.  She is currently monitored for the following issues for this low-risk pregnancy and has High-risk pregnancy; Symmetric IUGR complicating pregnancy, antepartum; Active labor; Abnormal glucose tolerance test (GTT) during pregnancy, antepartum; Indication for care in labor or delivery; Precipitous delivery; Postpartum care following vaginal delivery; History of right salpingo-oophorectomy; and Supervision of other normal pregnancy, antepartum on their problem list.  Patient reports no complaints.  Contractions: Not present. Vag. Bleeding: None.  Movement: Absent. Denies leaking of fluid.   The following portions of the patient's history were reviewed and updated as appropriate: allergies, current medications, past family history, past medical history, past social history, past surgical history and problem list.   Objective:   Vitals:   10/05/23 0918  BP: 99/66  Pulse: 81  Weight: 164 lb 3.2 oz (74.5 kg)    Fetal Status: Fetal Heart Rate (bpm): 155 Fundal Height: 11 cm Movement: Absent     General:  Alert, oriented and cooperative. Patient is in no acute distress.  Skin: Skin is warm and dry. No rash noted.   Cardiovascular: Normal heart rate and rhythm noted  Respiratory: Normal respiratory effort, no problems with respiration noted. Clear to auscultation.   Abdomen: Soft, gravid, appropriate for gestational age. Normal bowel sounds. Non-tender. Pain/Pressure: Absent     Pelvic: Cervical exam performed in presence of chaperone       Normal cervical contour, no lesions, no bleeding following pap, normal discharge  Extremities: Normal range of motion.  Edema: None  Mental Status: Normal mood and affect. Normal behavior. Normal judgment and thought content.    Indications for ASA therapy (per uptodate) One of the following: Previous  pregnancy with preeclampsia, especially early onset and with an adverse outcome No Multifetal gestation No Chronic hypertension No Type 1 or 2 diabetes mellitus No Chronic kidney disease No Autoimmune disease (antiphospholipid syndrome, systemic lupus erythematosus) No  Two or more of the following: Nulliparity No Obesity (body mass index >30 kg/m2) No Family history of preeclampsia in mother or sister No Age >=35 years No Sociodemographic characteristics (African American race, low socioeconomic level) Yes Personal risk factors (eg, previous pregnancy with low birth weight or small for gestational age infant, previous adverse pregnancy outcome [eg, stillbirth], interval >10 years between pregnancies) Yes   Assessment and Plan:  Pregnancy: G5P3013 at [redacted]w[redacted]d  1. Supervision of other normal pregnancy, antepartum (Primary) Initial labs drawn. Continue prenatal vitamins. Genetic Screening discussed: NIPS, carrier screening and AFP  Ultrasound discussed; fetal anatomic survey: 11/21/23 Problem list reviewed and updated. Reviewed Brx optimized schedule, patient agreeable The nature of [facility] with multiple MDs and other Advanced Practice Providers was explained to patient; also emphasized that residents, students are part of our team. Routine obstetric precautions reviewed.  - Cytology - PAP - aspirin  EC 81 MG tablet; Take 1 tablet (81 mg total) by mouth daily. Swallow whole.  Dispense: 30 tablet; Refill: 12 - CBC/D/Plt+RPR+Rh+ABO+RubIgG... - Hemoglobin A1c - Comprehensive metabolic panel with GFR - PANORAMA PRENATAL TEST - HORIZON CUSTOM - Cervicovaginal ancillary only  2. [redacted] weeks gestation of pregnancy Anticipatory guidance about next visits/weeks of pregnancy given.   3. History of gestational diabetes in prior pregnancy, currently pregnant   4. Positive depression screening Score 6 on PHQ-9; patient reports this is related to her n/v  in pregnancy, which  is improving on current medication. Reports no SI/HI.  Declines referral to [facility].  Preterm labor/first trimester warning symptoms and general obstetric precautions including but not limited to vaginal bleeding, contractions, leaking of fluid and fetal movement were reviewed in detail with the patient.  Please refer to After Visit Summary for other counseling recommendations.   Return in about 4 weeks (around 11/02/2023) for LOB.  Future Appointments  Date Time Provider Department Center  11/21/2023  8:00 AM [facility] PROVIDER 1 [facility]  11/21/2023  8:30 AM [facility] US3 [facility]    Jorene FORBES Moats, PA-C

## 2023-10-05 NOTE — Progress Notes (Signed)
 Pt scored a 6 on PHQ; declined referral to behavioral health.   Pt still experiencing n/v; but states it is much better.   No other concerns at this time.

## 2023-10-06 LAB — CBC/D/PLT+RPR+RH+ABO+RUBIGG...
Antibody Screen: NEGATIVE
Basophils Absolute: 0 x10E3/uL (ref 0.0–0.2)
Basos: 1 %
EOS (ABSOLUTE): 0.2 x10E3/uL (ref 0.0–0.4)
Eos: 3 %
HCV Ab: NONREACTIVE
HIV Screen 4th Generation wRfx: NONREACTIVE
Hematocrit: 35.2 % (ref 34.0–46.6)
Hemoglobin: 11.1 g/dL (ref 11.1–15.9)
Hepatitis B Surface Ag: NEGATIVE
Immature Grans (Abs): 0 x10E3/uL (ref 0.0–0.1)
Immature Granulocytes: 0 %
Lymphocytes Absolute: 2 x10E3/uL (ref 0.7–3.1)
Lymphs: 29 %
MCH: 27.2 pg (ref 26.6–33.0)
MCHC: 31.5 g/dL (ref 31.5–35.7)
MCV: 86 fL (ref 79–97)
Monocytes Absolute: 0.8 x10E3/uL (ref 0.1–0.9)
Monocytes: 11 %
Neutrophils Absolute: 3.9 x10E3/uL (ref 1.4–7.0)
Neutrophils: 56 %
Platelets: 272 x10E3/uL (ref 150–450)
RBC: 4.08 x10E6/uL (ref 3.77–5.28)
RDW: 13.1 % (ref 11.7–15.4)
RPR Ser Ql: NONREACTIVE
Rh Factor: POSITIVE
Rubella Antibodies, IGG: 29 {index} (ref >0.99)
WBC: 7 x10E3/uL (ref 3.4–10.8)

## 2023-10-06 LAB — COMPREHENSIVE METABOLIC PANEL WITH GFR
ALT: 19 IU/L (ref 0–32)
AST: 19 IU/L (ref 0–40)
Albumin: 3.8 g/dL — ABNORMAL LOW (ref 3.9–4.9)
Alkaline Phosphatase: 61 IU/L (ref 41–116)
BUN/Creatinine Ratio: 7 — ABNORMAL LOW (ref 9–23)
BUN: 4 mg/dL — ABNORMAL LOW (ref 6–20)
Bilirubin Total: 0.3 mg/dL (ref 0.0–1.2)
CO2: 19 mmol/L — ABNORMAL LOW (ref 20–29)
Calcium: 9.3 mg/dL (ref 8.7–10.2)
Chloride: 101 mmol/L (ref 96–106)
Creatinine, Ser: 0.55 mg/dL — ABNORMAL LOW (ref 0.57–1.00)
Globulin, Total: 2.7 g/dL (ref 1.5–4.5)
Glucose: 69 mg/dL — ABNORMAL LOW (ref 70–99)
Potassium: 4 mmol/L (ref 3.5–5.2)
Sodium: 137 mmol/L (ref 134–144)
Total Protein: 6.5 g/dL (ref 6.0–8.5)
eGFR: 126 mL/min/1.73 (ref >59)

## 2023-10-06 LAB — CERVICOVAGINAL ANCILLARY ONLY
Chlamydia: NEGATIVE
Comment: NEGATIVE
Comment: NEGATIVE
Comment: NORMAL
Neisseria Gonorrhea: NEGATIVE
Trichomonas: NEGATIVE

## 2023-10-06 LAB — HEMOGLOBIN A1C
Est. average glucose Bld gHb Est-mCnc: 114 mg/dL
Hgb A1c MFr Bld: 5.6 % (ref 4.8–5.6)

## 2023-10-06 LAB — HCV INTERPRETATION

## 2023-10-07 LAB — CYTOLOGY - PAP
Comment: NEGATIVE
Diagnosis: UNDETERMINED — AB
High risk HPV: NEGATIVE

## 2023-10-10 ENCOUNTER — Ambulatory Visit (HOSPITAL_COMMUNITY): Payer: Self-pay | Admitting: Physician Assistant

## 2023-10-11 LAB — PANORAMA PRENATAL TEST FULL PANEL:PANORAMA TEST PLUS 5 ADDITIONAL MICRODELETIONS: FETAL FRACTION: 5.1

## 2023-10-16 LAB — HORIZON CUSTOM: REPORT SUMMARY: POSITIVE — AB

## 2023-10-17 ENCOUNTER — Other Ambulatory Visit: Payer: Self-pay

## 2023-10-17 ENCOUNTER — Emergency Department
Admission: EM | Admit: 2023-10-17 | Discharge: 2023-10-17 | Disposition: A | Discharge status: Home / Self Care | Attending: Emergency Medicine | Admitting: Emergency Medicine

## 2023-10-17 DIAGNOSIS — Z3490 Encounter for supervision of normal pregnancy, unspecified, unspecified trimester: Secondary | ICD-10-CM

## 2023-10-17 DIAGNOSIS — Z3A14 14 weeks gestation of pregnancy: Secondary | ICD-10-CM | POA: Insufficient documentation

## 2023-10-17 DIAGNOSIS — E860 Dehydration: Secondary | ICD-10-CM | POA: Diagnosis not present

## 2023-10-17 DIAGNOSIS — R8271 Bacteriuria: Secondary | ICD-10-CM

## 2023-10-17 DIAGNOSIS — D563 Thalassemia minor: Secondary | ICD-10-CM

## 2023-10-17 DIAGNOSIS — O2392 Unspecified genitourinary tract infection in pregnancy, second trimester: Secondary | ICD-10-CM | POA: Diagnosis not present

## 2023-10-17 DIAGNOSIS — R55 Syncope and collapse: Secondary | ICD-10-CM | POA: Diagnosis not present

## 2023-10-17 DIAGNOSIS — O26892 Other specified pregnancy related conditions, second trimester: Secondary | ICD-10-CM | POA: Diagnosis present

## 2023-10-17 LAB — URINALYSIS, ROUTINE W REFLEX MICROSCOPIC
Bilirubin Urine: NEGATIVE
Glucose, UA: NEGATIVE mg/dL
Hgb urine dipstick: NEGATIVE
Ketones, ur: NEGATIVE mg/dL
Leukocytes,Ua: NEGATIVE
Nitrite: NEGATIVE
Protein, ur: 30 mg/dL — AB
Specific Gravity, Urine: 1.027 (ref 1.005–1.030)
pH: 6 (ref 5.0–8.0)

## 2023-10-17 LAB — COMPREHENSIVE METABOLIC PANEL WITH GFR
ALT: 24 U/L (ref 0–44)
AST: 24 U/L (ref 15–41)
Albumin: 3.1 g/dL — ABNORMAL LOW (ref 3.5–5.0)
Alkaline Phosphatase: 50 U/L (ref 38–126)
Anion gap: 11 (ref 5–15)
BUN: 7 mg/dL (ref 6–20)
CO2: 22 mmol/L (ref 22–32)
Calcium: 9.3 mg/dL (ref 8.9–10.3)
Chloride: 101 mmol/L (ref 98–111)
Creatinine, Ser: 0.52 mg/dL (ref 0.44–1.00)
GFR, Estimated: >60 mL/min (ref >60)
Glucose, Bld: 112 mg/dL — ABNORMAL HIGH (ref 70–99)
Potassium: 3.4 mmol/L — ABNORMAL LOW (ref 3.5–5.1)
Sodium: 134 mmol/L — ABNORMAL LOW (ref 135–145)
Total Bilirubin: 0.3 mg/dL (ref 0.0–1.2)
Total Protein: 6.9 g/dL (ref 6.5–8.1)

## 2023-10-17 LAB — CBC
HCT: 33.9 % — ABNORMAL LOW (ref 36.0–46.0)
Hemoglobin: 11.1 g/dL — ABNORMAL LOW (ref 12.0–15.0)
MCH: 27.1 pg (ref 26.0–34.0)
MCHC: 32.7 g/dL (ref 30.0–36.0)
MCV: 82.9 fL (ref 80.0–100.0)
Platelets: 278 K/uL (ref 150–400)
RBC: 4.09 MIL/uL (ref 3.87–5.11)
RDW: 13.6 % (ref 11.5–15.5)
WBC: 6 K/uL (ref 4.0–10.5)
nRBC: 0 % (ref 0.0–0.2)

## 2023-10-17 LAB — TROPONIN I (HIGH SENSITIVITY)
Troponin I (High Sensitivity): 2 ng/L (ref <18)
Troponin I (High Sensitivity): <2 ng/L (ref <18)

## 2023-10-17 MED ORDER — CEPHALEXIN 500 MG PO CAPS: 500.0000 mg | ORAL_CAPSULE | Freq: Two times a day (BID) | ORAL | 0 refills | Status: AC

## 2023-10-17 MED ORDER — CEPHALEXIN 500 MG PO CAPS
500.0000 mg | ORAL_CAPSULE | Freq: Once | ORAL | Status: AC
Start: 2023-10-17 — End: 2023-10-17
  Administered 2023-10-17: 500 mg via ORAL
  Filled 2023-10-17: qty 1

## 2023-10-17 MED ORDER — SODIUM CHLORIDE 0.9 % IV BOLUS
1000.0000 mL | Freq: Once | INTRAVENOUS | Status: AC
  Administered 2023-10-17: 1000 mL via INTRAVENOUS

## 2023-10-17 NOTE — ED Triage Notes (Signed)
 Pt was at work when she passed felt dizzy and nausea and passed out. Denies pain or that she hit her head. Fall was witnessed and fellow staff assisted her to ground.  Worker's comp workup

## 2023-10-17 NOTE — Discharge Instructions (Signed)
 Please be sure to keep yourself hydrated, please make sure to take the antibiotics as prescribed for asymptomatic bacteriuria.  Make sure to follow-up with your OB/GYN for further management of your pregnancy.  Please follow-up with your primary care doctor this week or next week to get reassessed after your syncopal episode.

## 2023-10-17 NOTE — ED Triage Notes (Addendum)
 First nurse note: Pt was in OR and had syncopal episode. Pt A&Ox4 on arrival. Pt approximately [redacted]wks pregnant

## 2023-10-17 NOTE — ED Provider Notes (Signed)
 [facility] Provider Note    Event Date/Time   First MD Initiated Contact with Patient 10/17/23 [phone]     (approximate)   History   Loss of Consciousness   HPI  Shelley Bishop is a 33 y.o. female [redacted] weeks pregnant presenting with syncopal episode.  Patient was at work, felt lightheaded and nauseous, passed out.  Episode was witnessed and she was lowered to the ground.  No chest pain or shortness of breath.  No leg swelling.  No abdominal pain, vaginal bleeding, gush of fluid.  Denies any issues with pregnancy so far.  Denies any infectious symptoms.  States that she has no this morning but did not eat very much, states that she felt a little lightheaded and queasy when she went to work today.  No prior cardiac history.  On independent chart review, she had an OB ultrasound done in early September, showed viable IUP at 9 weeks.     Physical Exam   Triage Vital Signs: ED Triage Vitals  Encounter Vitals Group     BP 10/17/23 0842 105/78     Girls Systolic BP Percentile --      Girls Diastolic BP Percentile --      Boys Systolic BP Percentile --      Boys Diastolic BP Percentile --      Pulse Rate 10/17/23 0841 68     Resp 10/17/23 0842 16     Temp 10/17/23 0841 98.4 F (36.9 C)     Temp Source 10/17/23 0841 Oral     SpO2 10/17/23 0842 98 %     Weight 10/17/23 0842 164 lb (74.4 kg)     Height 10/17/23 0842 5' 4 (1.626 m)     Head Circumference --      Peak Flow --      Pain Score 10/17/23 0842 0     Pain Loc --      Pain Education --      Exclude from Growth Chart --     Most recent vital signs: Vitals:   10/17/23 1130 10/17/23 1200  BP: 104/71 101/66  Pulse: 66 80  Resp: 15 18  Temp:    SpO2: 100% 100%     General: Awake, no distress.  CV:  Good peripheral perfusion.  Resp:  Normal effort.  No tachypnea or respiratory distress Abd:  Soft, gravid, nontender Other:  No lower extremity edema, no unilateral calf swelling or  tenderness   ED Results / Procedures / Treatments   Labs (all labs ordered are listed, but only abnormal results are displayed) Labs Reviewed  COMPREHENSIVE METABOLIC PANEL WITH GFR - Abnormal; Notable for the following components:      Result Value   Sodium 134 (*)    Potassium 3.4 (*)    Glucose, Bld 112 (*)    Albumin 3.1 (*)    All other components within normal limits  CBC - Abnormal; Notable for the following components:   Hemoglobin 11.1 (*)    HCT 33.9 (*)    All other components within normal limits  URINALYSIS, ROUTINE W REFLEX MICROSCOPIC - Abnormal; Notable for the following components:   Color, Urine AMBER (*)    APPearance CLOUDY (*)    Protein, ur 30 (*)    Bacteria, UA RARE (*)    All other components within normal limits  URINE CULTURE  CBG MONITORING, ED  TROPONIN I (HIGH SENSITIVITY)  TROPONIN I (HIGH SENSITIVITY)  EKG  EKG shows, sinus rhythm with sinus arrhythmia, rate of 75, normal QRS, normal QTc, T wave inversion to V2, no obvious ischemic ST elevation, not significantly changed compared to prior     PROCEDURES:  Critical Care performed: No  Ultrasound ED OB Pelvic  Date/Time: 10/17/2023 9:34 AM  Performed by: Waymond Lorelle Cummins, MD Authorized by: Waymond Lorelle Cummins, MD   Procedure details:    Indications: evaluate for IUP     Assess:  Intrauterine pregnancy   Technique:  Transabdominal obstetric (HCG+) exam   Images: not archived    Uterine findings:    Intrauterine pregnancy: identified      Comments:     Intrauterine pregnancy with fetal movement, heart rate was 158    MEDICATIONS ORDERED IN ED: Medications  sodium chloride  0.9 % bolus 1,000 mL (0 mLs Intravenous Stopped 10/17/23 1116)  cephALEXin (KEFLEX) capsule 500 mg (500 mg Oral Given 10/17/23 1115)     IMPRESSION / MDM / ASSESSMENT AND PLAN / ED COURSE  I reviewed the triage vital signs and the nursing notes.                              Differential diagnosis includes,  but is not limited to, vasovagal, arrhythmia, atypical ACS, dehydration, electrolyte derangements, considered PE but this is less likely as patient has no leg swelling, no shortness of breath or chest pain.  She is not hypoxic or tachycardic.  Will get labs, EKG, troponin, IV fluids.  Will do bedside ultrasound to assess fetal heart rate.  Patient's presentation is most consistent with acute presentation with potential threat to life or bodily function.  Independent interpretation of labs and imaging below.  Clinical course below.  Labs are reassuring, troponin x 2 is negative.  Patient was observed in the emergency department without repeat symptoms.  Ambulated without difficulty.  Considered but no indication for inpatient admission at this time, she safe for outpatient management. Will give her a course of Keflex for her asymptomatic bacteriuria.  Instructed her to follow-up with primary care doctor and OB/GYN for further management of her pregnancy and her syncopal episode.  Encouraged hydration.  Hydration. Will discharge with strict return precautions.  Shared decision making done with patient and she is agreeable with this plan.  The patient is on the cardiac monitor to evaluate for evidence of arrhythmia and/or significant heart rate changes.   Clinical Course as of 10/17/23 1218  Mon Oct 17, 2023  1111 Independent review of labs, initial troponin is negative, electrolytes are not severely deranged, LFTs are normal, creatinine is not elevated, UA does show rare bacteria, will treat with Keflex for asymptomatic bacteriuria. [TT]  1212 Troponin I (High Sensitivity) Troponin x 2 is not elevated. [TT]    Clinical Course User Index [TT] Waymond Lorelle Cummins, MD     FINAL CLINICAL IMPRESSION(S) / ED DIAGNOSES   Final diagnoses:  Syncope, unspecified syncope type  Dehydration  Asymptomatic bacteriuria during pregnancy  Pregnancy, unspecified gestational age     Rx / DC Orders   ED Discharge  Orders          Ordered    cephALEXin (KEFLEX) 500 MG capsule  2 times daily        10/17/23 1213             Note:  This document was prepared using Dragon voice recognition software and may include unintentional dictation errors.  Waymond Lorelle Cummins, MD 10/17/23 [phone]

## 2023-10-18 LAB — URINE CULTURE: Culture: NO GROWTH

## 2023-10-19 ENCOUNTER — Encounter: Admitting: Obstetrics and Gynecology

## 2023-10-20 ENCOUNTER — Encounter: Payer: Self-pay | Admitting: Obstetrics

## 2023-10-20 ENCOUNTER — Ambulatory Visit (INDEPENDENT_AMBULATORY_CARE_PROVIDER_SITE_OTHER): Admitting: Obstetrics

## 2023-10-20 VITALS — BP 106/66 | HR 78 | Wt 163.0 lb

## 2023-10-20 DIAGNOSIS — O09299 Supervision of pregnancy with other poor reproductive or obstetric history, unspecified trimester: Secondary | ICD-10-CM | POA: Diagnosis not present

## 2023-10-20 DIAGNOSIS — O0992 Supervision of high risk pregnancy, unspecified, second trimester: Secondary | ICD-10-CM

## 2023-10-20 DIAGNOSIS — O99814 Abnormal glucose complicating childbirth: Secondary | ICD-10-CM

## 2023-10-20 DIAGNOSIS — Z9079 Acquired absence of other genital organ(s): Secondary | ICD-10-CM

## 2023-10-20 DIAGNOSIS — Z90721 Acquired absence of ovaries, unilateral: Secondary | ICD-10-CM

## 2023-10-20 DIAGNOSIS — O219 Vomiting of pregnancy, unspecified: Secondary | ICD-10-CM

## 2023-10-20 MED ORDER — DOXYLAMINE-PYRIDOXINE 10-10 MG PO TBEC
DELAYED_RELEASE_TABLET | ORAL | 5 refills | Status: AC
Start: 2023-10-20 — End: ?

## 2023-10-20 NOTE — Progress Notes (Signed)
 Issues with insurance covering BP cuff from [facility]. Not taking PNV because cause nausea.

## 2023-10-20 NOTE — Progress Notes (Signed)
 Subjective:  Shelley Bishop is a 33 y.o. H4E6986 at [redacted]w[redacted]d being seen today for ongoing prenatal care.  She is currently monitored for the following issues for this low-risk pregnancy and has High-risk pregnancy; Symmetric IUGR complicating pregnancy, antepartum; Active labor; Abnormal glucose tolerance test (GTT) during pregnancy, antepartum; Indication for care in labor or delivery; Precipitous delivery; Postpartum care following vaginal delivery; History of right salpingo-oophorectomy; and Supervision of other normal pregnancy, antepartum on their problem list.  Patient reports nausea and vomiting.  Contractions: Not present. Vag. Bleeding: None.  Movement: Present. Denies leaking of fluid.   The following portions of the patient's history were reviewed and updated as appropriate: allergies, current medications, past family history, past medical history, past social history, past surgical history and problem list. Problem list updated.  Objective:   Vitals:   10/20/23 1317  BP: 106/66  Pulse: 78  Weight: 163 lb (73.9 kg)    Fetal Status: Fetal Heart Rate (bpm): 153   Movement: Present     General:  Alert, oriented and cooperative. Patient is in no acute distress.  Skin: Skin is warm and dry. No rash noted.   Cardiovascular: Normal heart rate noted  Respiratory: Normal respiratory effort, no problems with respiration noted  Abdomen: Soft, gravid, appropriate for gestational age. Pain/Pressure: Absent     Pelvic:  Cervical exam deferred        Extremities: Normal range of motion.  Edema: None  Mental Status: Normal mood and affect. Normal behavior. Normal judgment and thought content.   Urinalysis:      Assessment and Plan:  Pregnancy: H4E6986 at [redacted]w[redacted]d  1. High-risk pregnancy in second trimester (Primary)  2. IUGR (intrauterine growth restriction) in prior pregnancy, pregnant  3. Abnormal glucose tolerance test (GTT) during pregnancy, delivered - has normal HGB A1C  4.  History of right salpingo-oophorectomy  5. Nausea and vomiting during pregnancy   - Doxylamine-Pyridoxine (DICLEGIS) 10-10 MG TBEC; 1 tab in AM, 1 tab mid afternoon 2 tabs at bedtime. Max dose 4 tabs daily.  Dispense: 100 tablet; Refill: 5   There are no diagnoses linked to this encounter. Preterm labor symptoms and general obstetric precautions including but not limited to vaginal bleeding, contractions, leaking of fluid and fetal movement were reviewed in detail with the patient. Please refer to After Visit Summary for other counseling recommendations.   Return in about 2 weeks (around 11/03/2023) for ROB.  AFP.SABRA   Rudy Carlin LABOR, MD 10/20/2023

## 2023-10-21 ENCOUNTER — Other Ambulatory Visit: Payer: Self-pay | Admitting: Physician Assistant

## 2023-10-21 ENCOUNTER — Encounter: Payer: Self-pay | Admitting: Physician Assistant

## 2023-10-21 DIAGNOSIS — D563 Thalassemia minor: Secondary | ICD-10-CM | POA: Insufficient documentation

## 2023-10-21 DIAGNOSIS — R55 Syncope and collapse: Secondary | ICD-10-CM

## 2023-11-02 ENCOUNTER — Ambulatory Visit (INDEPENDENT_AMBULATORY_CARE_PROVIDER_SITE_OTHER): Admitting: Physician Assistant

## 2023-11-02 ENCOUNTER — Encounter: Admitting: Physician Assistant

## 2023-11-02 VITALS — BP 110/67 | HR 86 | Wt 166.6 lb

## 2023-11-02 DIAGNOSIS — Z3480 Encounter for supervision of other normal pregnancy, unspecified trimester: Secondary | ICD-10-CM

## 2023-11-02 DIAGNOSIS — O09299 Supervision of pregnancy with other poor reproductive or obstetric history, unspecified trimester: Secondary | ICD-10-CM

## 2023-11-02 DIAGNOSIS — D563 Thalassemia minor: Secondary | ICD-10-CM

## 2023-11-02 DIAGNOSIS — Z3A16 16 weeks gestation of pregnancy: Secondary | ICD-10-CM

## 2023-11-02 DIAGNOSIS — Z8632 Personal history of gestational diabetes: Secondary | ICD-10-CM

## 2023-11-02 NOTE — Progress Notes (Signed)
   PRENATAL VISIT NOTE  Subjective:  Shelley Bishop is a 33 y.o. H4E6986 at [redacted]w[redacted]d being seen today for ongoing prenatal care.  She is currently monitored for the following issues for this high-risk pregnancy and has High-risk pregnancy; Symmetric IUGR complicating pregnancy, antepartum; Active labor; Abnormal glucose tolerance test (GTT) during pregnancy, antepartum; Indication for care in labor or delivery; Precipitous delivery; Postpartum care following vaginal delivery; History of right salpingo-oophorectomy; Supervision of other normal pregnancy, antepartum; and Alpha thalassemia silent carrier on their problem list.  Patient reports no complaints.  Contractions: Not present. Vag. Bleeding: None.  Movement: Absent. Denies leaking of fluid.   The following portions of the patient's history were reviewed and updated as appropriate: allergies, current medications, past family history, past medical history, past social history, past surgical history and problem list.   Objective:    Vitals:   11/02/23 1310  BP: 110/67  Pulse: 86  Weight: 166 lb 9.6 oz (75.6 kg)    Fetal Status:  Fetal Heart Rate (bpm): 145 Fundal Height: 15 cm Movement: Absent    General: Alert, oriented and cooperative. Patient is in no acute distress.  Skin: Skin is warm and dry. No rash noted.   Cardiovascular: Normal heart rate noted  Respiratory: Normal respiratory effort, no problems with respiration noted  Abdomen: Soft, gravid, appropriate for gestational age.  Pain/Pressure: Absent     Pelvic: Cervical exam deferred        Extremities: Normal range of motion.  Edema: None  Mental Status: Normal mood and affect. Normal behavior. Normal judgment and thought content.   Assessment and Plan:  Pregnancy: H4E6986 at [redacted]w[redacted]d  1. Supervision of other normal pregnancy, antepartum (Primary) Patient doing well BP, FHR, FH appropriate   2. [redacted] weeks gestation of pregnancy Anticipatory guidance about next  visits/weeks of pregnancy given.   3. History of gestational diabetes in prior pregnancy, currently pregnant   4. Alpha thalassemia silent carrier Sending FOB kit  Preterm labor symptoms and general obstetric precautions including but not limited to vaginal bleeding, contractions, leaking of fluid and fetal movement were reviewed in detail with the patient.  Please refer to After Visit Summary for other counseling recommendations.   Return in about 4 weeks (around 11/30/2023) for LOB.  Future Appointments  Date Time Provider Department Center  11/04/2023  4:20 PM Tobb, Kardie, DO [facility] None  12/21/2023  9:00 AM [facility] PROVIDER 1 [facility]  12/21/2023  9:30 AM [facility] US2 [facility]    Jorene FORBES Moats, PA-C

## 2023-11-02 NOTE — Progress Notes (Signed)
 Pt presents for rob. Pt has no questions or concerns at this time.

## 2023-11-04 ENCOUNTER — Ambulatory Visit (INDEPENDENT_AMBULATORY_CARE_PROVIDER_SITE_OTHER): Admitting: Cardiology

## 2023-11-04 ENCOUNTER — Encounter: Payer: Self-pay | Admitting: Cardiology

## 2023-11-04 VITALS — BP 120/64 | HR 87 | Ht 64.0 in | Wt 166.0 lb

## 2023-11-04 DIAGNOSIS — Z3A16 16 weeks gestation of pregnancy: Secondary | ICD-10-CM | POA: Diagnosis not present

## 2023-11-04 DIAGNOSIS — R55 Syncope and collapse: Secondary | ICD-10-CM

## 2023-11-04 NOTE — Patient Instructions (Signed)
   Testing/Procedures:  Your physician has requested that you have an echocardiogram. Echocardiography is a painless test that uses sound waves to create images of your heart. It provides your doctor with information about the size and shape of your heart and how well your heart's chambers and valves are working. This procedure takes approximately one hour. There are no restrictions for this procedure. Please do NOT wear cologne, perfume, aftershave, or lotions (deodorant is allowed). Please arrive 15 minutes prior to your appointment time.  Please note: We ask at that you not bring children with you during ultrasound (echo/ vascular) testing. Due to room size and safety concerns, children are not allowed in the ultrasound rooms during exams. Our front office staff cannot provide observation of children in our lobby area while testing is being conducted. An adult accompanying a patient to their appointment will only be allowed in the ultrasound room at the discretion of the ultrasound technician under special circumstances. We apologize for any inconvenience. [address]  Follow-Up: At [facility], you and your health needs are our priority.  As part of our continuing mission to provide you with exceptional heart care, our providers are all part of one team.  This team includes your primary Cardiologist (physician) and Advanced Practice Providers or APPs (Physician Assistants and Nurse Practitioners) who all work together to provide you with the care you need, when you need it.  Your next appointment:   AS NEEDED

## 2023-11-07 NOTE — Progress Notes (Signed)
 Cardio-Obstetrics Clinic  New Evaluation  Date:  11/07/2023   ID:  Shelley Bishop, DOB Nov 09, 1990, MRN [id number]  PCP:  Shelley Harland BROCKS, MD   [facility] Providers Cardiologist:  None  Electrophysiologist:  None       Referring MD: Davis, Devon E, PA-C   Chief Complaint:  I passed out some time ago at work  History of Present Illness:    Shelley Bishop is a 33 y.o. female [G5P3013] who is being seen today for the evaluation of syncope at the request of Davis, Devon E, PA-C.   Medical hx includes iron deficiency anemia associated with pregnancy.  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 Prior to losing consciousness, she had visual disturbances, stating 'I can't see,' and was assisted by colleagues. This was her first occurrence of syncope.  She occasionally experiences palpitations during work but does not consider them serious. She is currently [redacted] weeks pregnant and has not yet been tested for gestational diabetes in this pregnancy. She has a history of gestational diabetes in previous pregnancies but denies any history of heart disease or hypertension, including preeclampsia.  After the syncope episode, she was taken to the emergency room. She had been experiencing significant nausea, was not eating or drinking adequately, and felt dehydrated. Her family history is negative for heart disease. She is originally from [place] and speaks English and Hausa. She has three children aged 30, 48,   Prior CV Studies Reviewed: The following studies were reviewed today:   Past Medical History:  Diagnosis Date   Acne    Anemia 2020   during pregnancy    IUGR (intrauterine growth restriction)    Precipitous delivery 06/07/2018    Past Surgical History:  Procedure Laterality Date   LAPAROSCOPIC OVARIAN CYSTECTOMY Bilateral 05/14/2020   Procedure: LAPAROSCOPIC OVARIAN CYSTECTOMY, DERMOID CYSTS;  Surgeon: Delana Ted Morrison, DO;  Location: [facility];  Service: Gynecology;  Laterality: Bilateral;   LAPAROSCOPIC UNILATERAL SALPINGO OOPHERECTOMY N/A 05/14/2020   Procedure: LAPAROSCOPIC UNILATERAL SALPINGO OOPHORECTOMY;  Surgeon: Delana Ted Morrison, DO;  Location:  SURGERY CENTER;  Service: Gynecology;  Laterality: N/A;      OB History     Gravida  5   Para  3   Term  3   Preterm  0   AB  1   Living  3      SAB  1   IAB  0   Ectopic  0   Multiple  0   Live Births  3               Current Medications: Current Meds  Medication Sig   aspirin  EC 81 MG tablet Take 1 tablet (81 mg total) by mouth daily. Swallow whole.   diphenhydrAMINE  (BENADRYL ) 25 mg capsule Take 25 mg by mouth as needed.   Doxylamine-Pyridoxine (DICLEGIS PO) Take 1 tablet by mouth as needed (as needed).   Prenatal Vit-Fe Fumarate-FA (PRENATAL VITAMIN PO) Take 1 tablet by mouth daily.     Allergies:   Patient has no known allergies.   Social History   Socioeconomic History   Marital status: Married    Spouse name: Not on file   Number of children: Not on file   Years of education: Not on file   Highest education level: Not on file  Occupational History   Not on file  Tobacco Use   Smoking status: Never  Smokeless tobacco: Never  Vaping Use   Vaping status: Never Used  Substance and Sexual Activity   Alcohol use: Never   Drug use: Never   Sexual activity: Yes    Birth control/protection: Condom    Comment: would like to discuss other methods  Other Topics Concern   Not on file  Social History Narrative   Not on file   Social Drivers of Health   Financial Resource Strain: Not on file  Food Insecurity: Not on file  Transportation Needs: Not on file  Physical Activity: Not on file  Stress: Not on file  Social Connections: Not on file      Family History  Problem Relation Age of Onset   Hypertension Mother    Cancer Father        resolved       ROS:   Please see the history of present illness.    Palpitations and shortness of breath All other systems reviewed and are negative.   Labs/EKG Reviewed:    EKG:  None today, Prior reviewed   Recent Labs: 10/17/2023: ALT 24; BUN 7; Creatinine, Ser 0.52; Hemoglobin 11.1; Platelets 278; Potassium 3.4; Sodium 134   Recent Lipid Panel No results found for: CHOL, TRIG, HDL, CHOLHDL, LDLCALC, LDLDIRECT  Physical Exam:    VS:  BP 120/64 (BP Location: Left Arm, Patient Position: Sitting, Cuff Size: Normal)   Pulse 87   Ht 5' 4 (1.626 m)   Wt 166 lb (75.3 kg)   LMP 07/10/2023 (Exact Date)   SpO2 98%   BMI 28.49 kg/m     Wt Readings from Last 3 Encounters:  11/04/23 166 lb (75.3 kg)  11/02/23 166 lb 9.6 oz (75.6 kg)  10/20/23 163 lb (73.9 kg)     GEN:  Well nourished, well developed in no acute distress HEENT: Normal NECK: No JVD; No carotid bruits LYMPHATICS: No lymphadenopathy CARDIAC: RRR, no murmurs, rubs, gallops RESPIRATORY:  Clear to auscultation without rales, wheezing or rhonchi  ABDOMEN: Soft, non-tender, non-distended MUSCULOSKELETAL:  No edema; No deformity  SKIN: Warm and dry NEUROLOGIC:  Alert and oriented x 3 PSYCHIATRIC:  Normal affect    Risk Assessment/Risk Calculators:     CARPREG II Risk Prediction Index Score:  1.  The patient's risk for a primary cardiac event is 5%.   Modified World Health Organization [facility]) Classification of Maternal CV Risk   Class I         ASSESSMENT & PLAN:    Syncope likely due to dehydration in pregnancy Experienced a syncopal episode likely due to dehydration from nausea during pregnancy. No prior history of heart disease or hypertension. Differential diagnosis includes dehydration as the primary cause, with no immediate cardiac concerns. - Order echocardiogram to assess cardiac function. - Advise increased fluid intake. - Provided reassurance regarding benign nature of syncope.  Supervision of  normal pregnancy, second trimester Currently at [redacted] weeks gestation with no complications aside from recent syncope. No history of preeclampsia or gestational diabetes in previous pregnancies, although she had gestational diabetes in the past.   Patient Instructions   Testing/Procedures:  Your physician has requested that you have an echocardiogram. Echocardiography is a painless test that uses sound waves to create images of your heart. It provides your doctor with information about the size and shape of your heart and how well your heart's chambers and valves are working. This procedure takes approximately one hour. There are no restrictions for this procedure. Please do NOT wear cologne,  perfume, aftershave, or lotions (deodorant is allowed). Please arrive 15 minutes prior to your appointment time.  Please note: We ask at that you not bring children with you during ultrasound (echo/ vascular) testing. Due to room size and safety concerns, children are not allowed in the ultrasound rooms during exams. Our front office staff cannot provide observation of children in our lobby area while testing is being conducted. An adult accompanying a patient to their appointment will only be allowed in the ultrasound room at the discretion of the ultrasound technician under special circumstances. We apologize for any inconvenience. [address]  Follow-Up: At [facility], you and your health needs are our priority.  As part of our continuing mission to provide you with exceptional heart care, our providers are all part of one team.  This team includes your primary Cardiologist (physician) and Advanced Practice Providers or APPs (Physician Assistants and Nurse Practitioners) who all work together to provide you with the care you need, when you need it.  Your next appointment:    AS NEEDED           Dispo:  No follow-ups on file.   Medication Adjustments/Labs and Tests Ordered: Current  medicines are reviewed at length with the patient today.  Concerns regarding medicines are outlined above.  Tests Ordered: Orders Placed This Encounter  Procedures   ECHOCARDIOGRAM COMPLETE   Medication Changes: No orders of the defined types were placed in this encounter.

## 2023-11-15 ENCOUNTER — Telehealth: Payer: Self-pay

## 2023-11-21 ENCOUNTER — Ambulatory Visit

## 2023-11-21 ENCOUNTER — Other Ambulatory Visit

## 2023-11-30 ENCOUNTER — Ambulatory Visit: Admitting: Obstetrics and Gynecology

## 2023-11-30 VITALS — BP 115/67 | HR 96 | Wt 172.8 lb

## 2023-11-30 DIAGNOSIS — O09299 Supervision of pregnancy with other poor reproductive or obstetric history, unspecified trimester: Secondary | ICD-10-CM

## 2023-11-30 DIAGNOSIS — Z3A20 20 weeks gestation of pregnancy: Secondary | ICD-10-CM

## 2023-11-30 DIAGNOSIS — Z3480 Encounter for supervision of other normal pregnancy, unspecified trimester: Secondary | ICD-10-CM

## 2023-11-30 DIAGNOSIS — D563 Thalassemia minor: Secondary | ICD-10-CM | POA: Diagnosis not present

## 2023-11-30 DIAGNOSIS — O09292 Supervision of pregnancy with other poor reproductive or obstetric history, second trimester: Secondary | ICD-10-CM

## 2023-11-30 DIAGNOSIS — Z8632 Personal history of gestational diabetes: Secondary | ICD-10-CM

## 2023-11-30 NOTE — Progress Notes (Signed)
 Pt complains of itching of the breast for approx one week.

## 2023-11-30 NOTE — Progress Notes (Signed)
   PRENATAL VISIT NOTE  Subjective:  Shelley Bishop is a 33 y.o. H4E6986 at [redacted]w[redacted]d being seen today for ongoing prenatal care.  She is currently monitored for the following issues for this low-risk pregnancy and has History of prior pregnancy with IUGR newborn; History of gestational diabetes; History of precipitous delivery; History of right salpingo-oophorectomy; Supervision of other normal pregnancy, antepartum; and Alpha thalassemia silent carrier on their problem list.  Patient reports itching breast for the past week.  Contractions: Not present. Vag. Bleeding: None.  Movement: Present. Denies leaking of fluid.   The following portions of the patient's history were reviewed and updated as appropriate: allergies, current medications, past family history, past medical history, past social history, past surgical history and problem list.   Objective:   Vitals:   11/30/23 1043  BP: 115/67  Pulse: 96  Weight: 172 lb 12.8 oz (78.4 kg)    Fetal Status:  Fetal Heart Rate (bpm): 141   Movement: Present    General: Alert, oriented and cooperative. Patient is in no acute distress.  Skin: Skin is warm and dry. No rash noted.   Cardiovascular: Normal heart rate noted  Respiratory: Normal respiratory effort, no problems with respiration noted  Abdomen: Soft, gravid, appropriate for gestational age.  Pain/Pressure: Present     Pelvic: Cervical exam deferred        Extremities: Normal range of motion.  Edema: None  Mental Status: Normal mood and affect. Normal behavior. Normal judgment and thought content.     Assessment and Plan:  Pregnancy: H4E6986 at [redacted]w[redacted]d 1. Supervision of other normal pregnancy, antepartum (Primary) BP and FHR normal Doing well, feeling regular movement    2. History of gestational diabetes in prior pregnancy, currently pregnant A1c 5.6, gtt 26-28 weeks  3. [redacted] weeks gestation of pregnancy Anatomy scan 12/10 Discussed itching breast, no rash or pain present,  supportive measures. Follow up with worsening symptoms   4. Alpha thalassemia silent carrier Partner kit provided last visit, has not completed yet   Preterm labor symptoms and general obstetric precautions including but not limited to vaginal bleeding, contractions, leaking of fluid and fetal movement were reviewed in detail with the patient. Please refer to After Visit Summary for other counseling recommendations.   Return in about 4 weeks (around 12/28/2023) for OB VISIT (MD or APP).  Future Appointments  Date Time Provider Department Center  12/21/2023  9:00 AM [facility]  12/21/2023  9:30 AM [facility] US2 [facility]  12/21/2023 10:30 AM [facility] GENETIC COUNSELING RM [facility]  12/21/2023  1:05 PM [facility] 5 [facility]    Nidia Daring, FNP

## 2023-12-14 ENCOUNTER — Ambulatory Visit: Payer: Self-pay

## 2023-12-14 ENCOUNTER — Ambulatory Visit

## 2023-12-14 ENCOUNTER — Ambulatory Visit: Payer: Self-pay | Attending: Obstetrics and Gynecology | Admitting: Maternal & Fetal Medicine

## 2023-12-14 VITALS — BP 114/60 | HR 83

## 2023-12-14 DIAGNOSIS — Z8632 Personal history of gestational diabetes: Secondary | ICD-10-CM

## 2023-12-14 DIAGNOSIS — Z3480 Encounter for supervision of other normal pregnancy, unspecified trimester: Secondary | ICD-10-CM

## 2023-12-14 DIAGNOSIS — Z8759 Personal history of other complications of pregnancy, childbirth and the puerperium: Secondary | ICD-10-CM | POA: Insufficient documentation

## 2023-12-14 DIAGNOSIS — Z3A22 22 weeks gestation of pregnancy: Secondary | ICD-10-CM

## 2023-12-14 DIAGNOSIS — D563 Thalassemia minor: Secondary | ICD-10-CM

## 2023-12-14 NOTE — Progress Notes (Cosign Needed Addendum)
 [facility] at [facility] [address], [address] Phone:  [phone]   Fax:  [phone]      In-Person Genetic Counseling Clinic Note:   I spoke with 33 y.o. Shelley Bishop today to discuss her carrier screening results. She was referred by Alger Gong, MD.   Pregnancy History:    H4E6986. EGA: [redacted]w[redacted]d by LMP. EDD: 04/15/2024. Ethelle has three healthy children. She had one early SAB of unknown etiology. Reports she takes nausea medications and PNVs. Denies major personal health concerns. Denies bleeding, infections, and fevers in this pregnancy. Denies using tobacco, alcohol, or street drugs in this pregnancy.   Family History:    A three-generation pedigree was created and scanned into Epic under the Media tab.  Patient ethnicity reported as Black and FOB ethnicity reported as Black. Denies Ashkenazi Jewish ancestry.  Reports possible consanguinity with unknown degree of relationship. We discussed the increased risk for autosomal recessive conditions in children of consanguineous partnerships. She declined additional screening.  Family history not remarkable for individuals with birth defects, intellectual disability, autism spectrum disorder, multiple spontaneous abortions, still births, or unexplained neonatal death.   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 She screened negative for the other three conditions (CF, SMA, and beta-hemoglobinopathies) which significantly reduces but does not eliminate the chance of being a carrier for those conditions. Please see report for residual risk information.  We reviewed the genetics of alpha thalassemia, autosomal recessive mode of inheritance, and clinical features of these conditions. We reviewed that Miliani will either pass down two copies of the alpha  globin gene (??) OR one copy of the alpha globin gene (-?) in each pregnancy. Therefore, this pregnancy is not at increased risk for hemoglobin Bart's due to four deletions of the alpha globin genes (--/--) regardless of her reproductive partner's carrier status. The pregnancy will be at increased risk (25%) for hemoglobin H disease if her partner is an alpha thalassemia carrier in the cis configuration (--/??). We reviewed that this is more common in Southeast Asian populations and less common in those with Black ancestry.  Given these results, we discussed and offered carrier screening for Chivonne's reproductive partner. We reviewed the benefits and limitations of carrier screening and that it can detect most but not all carriers. She stated she will discuss this further with FOB, but she is likely to wait for postnatal testing. We also discussed the option of amniocentesis. We reviewed the technical aspects, benefits, risks, and limitations of amniocentesis including the 1 in 500 risk for miscarriage.    Newborn Screening. The [place]  Newborn Screening (NBS) program will screen all newborn babies for cystic fibrosis, spinal muscular atrophy, hemoglobinopathies, and numerous other conditions.  Previous Testing Completed:  Low risk NIPS: Valor previously completed Panorama noninvasive prenatal screening (NIPS) in this pregnancy. The result is low risk, consistent with a female fetus. This screening significantly reduces but does not eliminate the chance that the current pregnancy has Down syndrome (trisomy 59), trisomy 10, trisomy 49, common sex chromosome conditions, and 22q11.2 microdeletion syndrome. Please see report for residual risk information. There are many genetic conditions that cannot be detected by NIPS.    Plan of Care:   Patient will discuss carrier screening further with FOB. She stated she is likely to wait for postnatal testing (  newborn screening and referral to hematology as  clinically indicated). Declined amniocentesis. Follow-up MFC ultrasound on 01/25/2024.   Informed consent was obtained. All questions were answered.   30 minutes were spent on the date of the encounter in service to the patient including preparation, face-to-face consultation, discussion of test reports and available next steps, pedigree construction, genetic risk assessment, documentation, and care coordination.    Thank you for sharing in the care of Fraida with us .  Please do not hesitate to contact us  at [phone] if you have any questions.   Lauraine Bodily, MS, [facility] Certified Genetic Counselor   Genetic counseling student involved in appointment: No.

## 2023-12-14 NOTE — Progress Notes (Signed)
 Patient information  Patient Name: Shelley Bishop  Patient MRN:   [id number]  Referring practice: MFM Referring Provider: [place] [facility]  Problem List   Patient Active Problem List   Diagnosis Date Noted   Alpha thalassemia silent carrier 10/21/2023   Supervision of other normal pregnancy, antepartum 09/14/2023   History of right salpingo-oophorectomy 05/19/2020   History of precipitous delivery 06/07/2018   History of gestational diabetes 04/05/2018   History of prior pregnancy with IUGR newborn 11/20/2012    Maternal Fetal Medicine Consult Shelley Bishop is a 33 y.o. H4E6986 at [redacted]w[redacted]d here for ultrasound and consultation. She had low risk aneuploidy screening of a female fetus. Carrier screening was AT silent carrier. Maternal serum AFP n/a. She has no acute concerns.   Today we focused on the following:   The patient is here for her anatomy scan she has a history of FGR in her first pregnancy.  That newborn did well and does not have any health complications.  Her next 2 pregnancies were normal except for gestational diabetes in her last pregnancy.  She did not have macrosomia or any other complications.  I discussed the chance of recurrence for growth abnormalities a slightly higher with a previous history of a large or small baby therefore a growth ultrasound will be done in 6 weeks to assess the fetal biometry.  She has no other concerns at this time.  Will also review any anatomy that was not well-visualized today.  Patient is also an alpha thalassemia silent carrier and has had genetic counseling.  She has a history of a unilateral salpingo-oophorectomy of the right ovary due to an ovarian cyst that required surgery.  Recommendations -After review of the patients LMP, menstrual cycle history and any prior US , the EDD should be Estimated Date of Delivery: 04/15/24.  -Growth US  in 6 weeks due to hx of FGR  45 minutes of time was spent reviewing the patient's  chart including labs, imaging and documentation.  At least 50% of this time was spent with direct patient care discussing the diagnosis, management and prognosis of her care.  Review of Systems: A review of systems was performed and was negative except per HPI   Past Obstetrical History:  OB History  Gravida Para Term Preterm AB Living  5 3 3  0 1 3  SAB IAB Ectopic Multiple Live Births  1 0 0 0 3    # Outcome Date GA Lbr Len/2nd Weight Sex Type Anes PTL Lv  5 Current           4 SAB 2023     SAB     3 Term 06/07/18 [redacted]w[redacted]d 02:38 / 00:02 6 lb 13.7 oz (3.11 kg) F Vag-Spont None  LIV  2 Term 06/15/15 [redacted]w[redacted]d 01:41 / 00:05 6 lb 8.8 oz (2.97 kg) M Vag-Spont None  LIV  1 Term 11/25/12 [redacted]w[redacted]d 09:40 / 00:37 4 lb 13.8 oz (2.205 kg) M Vag-Spont EPI  LIV     Past Medical History:  Past Medical History:  Diagnosis Date   Acne    Anemia 2020   during pregnancy    IUGR (intrauterine growth restriction)    Precipitous delivery 06/07/2018     Past Surgical History:    Past Surgical History:  Procedure Laterality Date   LAPAROSCOPIC OVARIAN CYSTECTOMY Bilateral 05/14/2020   Procedure: LAPAROSCOPIC OVARIAN CYSTECTOMY, DERMOID CYSTS;  Surgeon: Delana Ted Morrison, DO;  Location: [facility];  Service: Gynecology;  Laterality:  Bilateral;   LAPAROSCOPIC UNILATERAL SALPINGO OOPHERECTOMY N/A 05/14/2020   Procedure: LAPAROSCOPIC UNILATERAL SALPINGO OOPHORECTOMY;  Surgeon: Delana Ted Morrison, DO;  Location: [facility];  Service: Gynecology;  Laterality: N/A;     Home Medications:   Current Outpatient Medications on File Prior to Visit  Medication Sig Dispense Refill   aspirin  EC 81 MG tablet Take 1 tablet (81 mg total) by mouth daily. Swallow whole. 30 tablet 12   Doxylamine -Pyridoxine  (DICLEGIS  PO) Take 1 tablet by mouth as needed (as needed).     Prenatal Vit-Fe Fumarate-FA (PRENATAL VITAMIN PO) Take 1 tablet by mouth daily.     Blood Pressure Monitoring (BLOOD  PRESSURE KIT) DEVI 1 Device by Does not apply route once a week. (Patient not taking: Reported on 11/04/2023) 1 each 0   diphenhydrAMINE  (BENADRYL ) 25 mg capsule Take 25 mg by mouth as needed. (Patient not taking: Reported on 12/14/2023)     Doxylamine -Pyridoxine  (DICLEGIS ) 10-10 MG TBEC 1 tab in AM, 1 tab mid afternoon 2 tabs at bedtime. Max dose 4 tabs daily. (Patient not taking: Reported on 11/04/2023) 100 tablet 5   Doxylamine -Pyridoxine  10-10 MG TBEC Take 2 tablets by mouth at bedtime. 2 tablets at bedtime, may take 1 tablet in AM and 1 in PM if needed (Patient not taking: Reported on 11/04/2023)     hydroxypropyl methylcellulose / hypromellose (ISOPTO TEARS / GONIOVISC) 2.5 % ophthalmic solution Place 1 drop into both eyes 3 (three) times daily. (Patient not taking: Reported on 11/04/2023) 15 mL 12   metoCLOPramide  (REGLAN ) 10 MG tablet Take 1 tablet (10 mg total) by mouth every 6 (six) hours. (Patient not taking: Reported on 11/04/2023) 30 tablet 0   ondansetron  (ZOFRAN -ODT) 4 MG disintegrating tablet Take 1 tablet (4 mg total) by mouth every 8 (eight) hours as needed for nausea or vomiting. (Patient not taking: Reported on 11/04/2023) 20 tablet 0   PRESCRIPTION MEDICATION at bedtime. Doxycycline all mg q day for acne (Patient not taking: Reported on 11/04/2023)     scopolamine  (TRANSDERM-SCOP) 1 MG/3DAYS Place 1 patch (1 mg total) onto the skin every 3 (three) days. (Patient not taking: Reported on 11/04/2023) 10 patch 1   simethicone  (GAS-X) 80 MG chewable tablet Chew 1 tablet (80 mg total) by mouth every 6 (six) hours as needed for flatulence. (Patient not taking: Reported on 11/04/2023) 30 tablet 0   No current facility-administered medications on file prior to visit.      Allergies:   No Known Allergies   Physical Exam:   Vitals:   12/14/23 1400  BP: 114/60  Pulse: 83   Sitting comfortably on the sonogram table Nonlabored breathing Normal rate and rhythm Abdomen is  nontender  Thank you for the opportunity to be involved with this patient's care. Please let us  know if we can be of any further assistance.   Delora Smaller MFM, [place]   12/14/2023  3:24 PM

## 2023-12-19 ENCOUNTER — Ambulatory Visit: Payer: Self-pay

## 2023-12-21 ENCOUNTER — Ambulatory Visit

## 2023-12-21 ENCOUNTER — Other Ambulatory Visit

## 2023-12-21 ENCOUNTER — Ambulatory Visit (HOSPITAL_COMMUNITY)
Admission: RE | Admit: 2023-12-21 | Discharge: 2023-12-21 | Attending: Cardiovascular Disease | Admitting: Cardiovascular Disease

## 2023-12-21 DIAGNOSIS — R55 Syncope and collapse: Secondary | ICD-10-CM | POA: Diagnosis present

## 2023-12-22 ENCOUNTER — Ambulatory Visit: Payer: Self-pay | Admitting: Cardiology

## 2023-12-22 LAB — ECHOCARDIOGRAM COMPLETE
Area-P 1/2: 4.46 cm2
S' Lateral: 2.1 cm

## 2023-12-28 ENCOUNTER — Encounter: Payer: Self-pay | Admitting: Obstetrics and Gynecology

## 2023-12-28 ENCOUNTER — Ambulatory Visit: Admitting: Obstetrics and Gynecology

## 2023-12-28 VITALS — BP 118/70 | HR 100 | Wt 178.2 lb

## 2023-12-28 DIAGNOSIS — Z3A24 24 weeks gestation of pregnancy: Secondary | ICD-10-CM

## 2023-12-28 DIAGNOSIS — Z3480 Encounter for supervision of other normal pregnancy, unspecified trimester: Secondary | ICD-10-CM | POA: Diagnosis not present

## 2023-12-28 DIAGNOSIS — D563 Thalassemia minor: Secondary | ICD-10-CM

## 2023-12-28 DIAGNOSIS — K649 Unspecified hemorrhoids: Secondary | ICD-10-CM | POA: Diagnosis not present

## 2023-12-28 DIAGNOSIS — Z8759 Personal history of other complications of pregnancy, childbirth and the puerperium: Secondary | ICD-10-CM

## 2023-12-28 DIAGNOSIS — Z8632 Personal history of gestational diabetes: Secondary | ICD-10-CM

## 2023-12-28 MED ORDER — HYDROCORTISONE (PERIANAL) 2.5 % EX CREA: TOPICAL_CREAM | Freq: Two times a day (BID) | CUTANEOUS | 2 refills | Status: AC

## 2023-12-28 NOTE — Progress Notes (Signed)
° °  PRENATAL VISIT NOTE  Subjective:  Shelley Bishop is a 33 y.o. H4E6986 at [redacted]w[redacted]d being seen today for ongoing prenatal care.  She is currently monitored for the following issues for this low-risk pregnancy and has History of prior pregnancy with IUGR newborn; History of gestational diabetes; History of precipitous delivery; History of right salpingo-oophorectomy; Supervision of other normal pregnancy, antepartum; Alpha thalassemia silent carrier; and History of prior pregnancy with SGA newborn on their problem list.  Patient reports reports vaginal pain/ pressure .  Contractions: Not present. Vag. Bleeding: None.  Movement: Present. Denies leaking of fluid.   The following portions of the patient's history were reviewed and updated as appropriate: allergies, current medications, past family history, past medical history, past social history, past surgical history and problem list.   Objective:   Vitals:   12/28/23 1122  BP: 118/70  Pulse: 100  Weight: 178 lb 3.2 oz (80.8 kg)    Fetal Status:  Fetal Heart Rate (bpm): 143   Movement: Present    General: Alert, oriented and cooperative. Patient is in no acute distress.  Skin: Skin is warm and dry. No rash noted.   Cardiovascular: Normal heart rate noted  Respiratory: Normal respiratory effort, no problems with respiration noted  Abdomen: Soft, gravid, appropriate for gestational age.  Pain/Pressure: Present     Pelvic: Cervical exam deferred        Extremities: Normal range of motion.  Edema: None  Mental Status: Normal mood and affect. Normal behavior. Normal judgment and thought content.   Assessment and Plan:  Pregnancy: H4E6986 at [redacted]w[redacted]d 1. Supervision of other normal pregnancy, antepartum (Primary) BP and FHR normal Doing well, feeling regular movement    2. Alpha thalassemia silent carrier Met with genetic counselor, postnatal testing   3. History of gestational diabetes A1c normal , GTT next visit   4. History of  prior pregnancy with SGA newborn 5. History of precipitous delivery Plan follow up growth 1/14  6. [redacted] weeks gestation of pregnancy Anticipatory guidance regarding GTT and labs next visit, discussed NPO status after midnight   7. Hemorrhoids, unspecified hemorrhoid type Discussed dietary changes  - hydrocortisone  (ANUSOL -HC) 2.5 % rectal cream; Place rectally 2 (two) times daily.  Dispense: 30 g; Refill: 2   Preterm labor symptoms and general obstetric precautions including but not limited to vaginal bleeding, contractions, leaking of fluid and fetal movement were reviewed in detail with the patient. Please refer to After Visit Summary for other counseling recommendations.   Return in about 4 weeks (around 01/25/2024) for OB VISIT (MD or APP), 2 hr GTT.  Future Appointments  Date Time Provider Department Center  01/24/2024  8:45 AM [facility] LAB [facility] None  01/24/2024  9:55 AM Nicholaus Burnard HERO, MD [facility] None  01/25/2024 11:15 AM [facility] PROVIDER 1 [facility]  01/25/2024 11:30 AM [facility] US5 [facility]    Nidia Daring, FNP

## 2023-12-28 NOTE — Progress Notes (Signed)
 Pt presents fro ROB visit. C/o vaginal pain/pressure

## 2024-01-12 ENCOUNTER — Encounter: Payer: Self-pay | Admitting: Obstetrics and Gynecology

## 2024-01-24 ENCOUNTER — Other Ambulatory Visit: Payer: Self-pay

## 2024-01-24 ENCOUNTER — Ambulatory Visit: Payer: Self-pay | Admitting: Obstetrics and Gynecology

## 2024-01-24 ENCOUNTER — Encounter: Payer: Self-pay | Admitting: Obstetrics and Gynecology

## 2024-01-24 VITALS — BP 112/70 | HR 106 | Wt 181.6 lb

## 2024-01-24 DIAGNOSIS — Z3483 Encounter for supervision of other normal pregnancy, third trimester: Secondary | ICD-10-CM

## 2024-01-24 DIAGNOSIS — Z3480 Encounter for supervision of other normal pregnancy, unspecified trimester: Secondary | ICD-10-CM

## 2024-01-24 DIAGNOSIS — D563 Thalassemia minor: Secondary | ICD-10-CM | POA: Diagnosis not present

## 2024-01-24 DIAGNOSIS — Z3A28 28 weeks gestation of pregnancy: Secondary | ICD-10-CM

## 2024-01-24 DIAGNOSIS — Z8759 Personal history of other complications of pregnancy, childbirth and the puerperium: Secondary | ICD-10-CM | POA: Diagnosis not present

## 2024-01-24 DIAGNOSIS — R12 Heartburn: Secondary | ICD-10-CM

## 2024-01-24 DIAGNOSIS — Z8632 Personal history of gestational diabetes: Secondary | ICD-10-CM | POA: Diagnosis not present

## 2024-01-24 MED ORDER — PANTOPRAZOLE SODIUM 20 MG PO TBEC: 20.0000 mg | DELAYED_RELEASE_TABLET | Freq: Every day | ORAL | 3 refills | Status: AC

## 2024-01-24 NOTE — Progress Notes (Signed)
 ROB; 28 week labs  Lower back pain for approx 2 weeks (scale 7 of 10).   Pt requesting something for heartburn/indigestion. She states TUMS makes her vomit.

## 2024-01-24 NOTE — Progress Notes (Signed)
 "  PRENATAL VISIT NOTE  Subjective:  Shelley Bishop is a 34 y.o. G5P3013 at [redacted]w[redacted]d being seen today for ongoing prenatal care.  She is currently monitored for the following issues for this high-risk pregnancy and has History of prior pregnancy with IUGR newborn; History of gestational diabetes; History of precipitous delivery; History of right salpingo-oophorectomy; Supervision of other normal pregnancy, antepartum; Alpha thalassemia silent carrier; and History of prior pregnancy with SGA newborn on their problem list.  Patient reports burning in her throat, occasional braxton hicks.  Contractions: Irritability. Vag. Bleeding: None.  Movement: Present. Denies leaking of fluid.   The following portions of the patient's history were reviewed and updated as appropriate: allergies, current medications, past family history, past medical history, past social history, past surgical history and problem list.   Objective:   Vitals:   01/24/24 0921  BP: 112/70  Pulse: (!) 106  Weight: 181 lb 9.6 oz (82.4 kg)    Fetal Status:  Fetal Heart Rate (bpm): 146   Movement: Present    General: Alert, oriented and cooperative. Patient is in no acute distress.  Skin: Skin is warm and dry. No rash noted.   Cardiovascular: Normal heart rate noted  Respiratory: Normal respiratory effort, no problems with respiration noted  Abdomen: Soft, gravid, appropriate for gestational age.  Pain/Pressure: Present     Pelvic: Cervical exam deferred        Extremities: Normal range of motion.  Edema: None  Mental Status: Normal mood and affect. Normal behavior. Normal judgment and thought content.      01/24/2024    9:26 AM 10/05/2023    9:38 AM 09/14/2023    8:54 AM  Depression screen PHQ 2/9  Decreased Interest 0 1 0  Down, Depressed, Hopeless 0 0 0  PHQ - 2 Score 0 1 0  Altered sleeping 1 1 0  Tired, decreased energy 1 1 0  Change in appetite 0 1 0  Feeling bad or failure about yourself  0 0 0  Trouble  concentrating 0 1 0  Moving slowly or fidgety/restless 0 1 0  Suicidal thoughts 0 0 0  PHQ-9 Score 2 6  0      Data saved with a previous flowsheet row definition        01/24/2024    9:27 AM 10/05/2023    9:38 AM 09/14/2023    8:54 AM  GAD 7 : Generalized Anxiety Score  Nervous, Anxious, on Edge 0 0 0  Control/stop worrying 0 0 0  Worry too much - different things 0 0 0  Trouble relaxing 0 0 0  Restless 0 0 0  Easily annoyed or irritable 0 0 0  Afraid - awful might happen 0 0 0  Total GAD 7 Score 0 0 0    Assessment and Plan:  Pregnancy: H4E6986 at [redacted]w[redacted]d 1. History of prior pregnancy with IUGR newborn (Primary)  2. History of gestational diabetes GTT today  3. Supervision of other normal pregnancy, antepartum 3rd trim labs today  4. History of prior pregnancy with SGA newborn Has f/u growth US  tomorrow  5. Alpha thalassemia silent carrier Has seen genetics, postnatal testing  6. [redacted] weeks gestation of pregnancy  7. Heartburn Protonix  sent to pharmacy  Preterm labor symptoms and general obstetric precautions including but not limited to vaginal bleeding, contractions, leaking of fluid and fetal movement were reviewed in detail with the patient. Please refer to After Visit Summary for other counseling recommendations.   Return  in about 2 weeks (around 02/07/2024) for high OB.  Future Appointments  Date Time Provider Department Center  01/25/2024 11:15 AM [facility] PROVIDER 1 [facility]  01/25/2024 11:30 AM [facility] US5 [facility]  02/07/2024  2:50 PM Anyanwu, Gloris LABOR, MD [facility] None    Burnard CHRISTELLA Moats, MD  "

## 2024-01-25 ENCOUNTER — Ambulatory Visit: Attending: Obstetrics and Gynecology

## 2024-01-25 ENCOUNTER — Other Ambulatory Visit: Payer: Self-pay | Admitting: *Deleted

## 2024-01-25 ENCOUNTER — Ambulatory Visit: Payer: Self-pay | Admitting: Obstetrics and Gynecology

## 2024-01-25 ENCOUNTER — Ambulatory Visit (HOSPITAL_BASED_OUTPATIENT_CLINIC_OR_DEPARTMENT_OTHER): Admitting: Obstetrics and Gynecology

## 2024-01-25 VITALS — BP 133/67 | HR 93

## 2024-01-25 DIAGNOSIS — Z8759 Personal history of other complications of pregnancy, childbirth and the puerperium: Secondary | ICD-10-CM | POA: Diagnosis present

## 2024-01-25 DIAGNOSIS — Z148 Genetic carrier of other disease: Secondary | ICD-10-CM

## 2024-01-25 DIAGNOSIS — O24419 Gestational diabetes mellitus in pregnancy, unspecified control: Secondary | ICD-10-CM | POA: Insufficient documentation

## 2024-01-25 DIAGNOSIS — Z3A28 28 weeks gestation of pregnancy: Secondary | ICD-10-CM | POA: Diagnosis not present

## 2024-01-25 DIAGNOSIS — O24410 Gestational diabetes mellitus in pregnancy, diet controlled: Secondary | ICD-10-CM | POA: Insufficient documentation

## 2024-01-25 DIAGNOSIS — O09293 Supervision of pregnancy with other poor reproductive or obstetric history, third trimester: Secondary | ICD-10-CM

## 2024-01-25 DIAGNOSIS — O24414 Gestational diabetes mellitus in pregnancy, insulin controlled: Secondary | ICD-10-CM | POA: Insufficient documentation

## 2024-01-25 DIAGNOSIS — O358XX0 Maternal care for other (suspected) fetal abnormality and damage, not applicable or unspecified: Secondary | ICD-10-CM

## 2024-01-25 LAB — SYPHILIS: RPR W/REFLEX TO RPR TITER AND TREPONEMAL ANTIBODIES, TRADITIONAL SCREENING AND DIAGNOSIS ALGORITHM: RPR Ser Ql: NONREACTIVE

## 2024-01-25 LAB — CBC
Hematocrit: 31.4 % — ABNORMAL LOW (ref 34.0–46.6)
Hemoglobin: 10.2 g/dL — ABNORMAL LOW (ref 11.1–15.9)
MCH: 27.2 pg (ref 26.6–33.0)
MCHC: 32.5 g/dL (ref 31.5–35.7)
MCV: 84 fL (ref 79–97)
Platelets: 256 x10E3/uL (ref 150–450)
RBC: 3.75 x10E6/uL — ABNORMAL LOW (ref 3.77–5.28)
RDW: 13.4 % (ref 11.7–15.4)
WBC: 9.3 x10E3/uL (ref 3.4–10.8)

## 2024-01-25 LAB — GLUCOSE TOLERANCE, 2 HOURS W/ 1HR
Glucose, 1 hour: 217 mg/dL — ABNORMAL HIGH (ref 70–179)
Glucose, 2 hour: 204 mg/dL — ABNORMAL HIGH (ref 70–152)
Glucose, Fasting: 85 mg/dL (ref 70–91)

## 2024-01-25 LAB — HIV ANTIBODY (ROUTINE TESTING W REFLEX): HIV Screen 4th Generation wRfx: NONREACTIVE

## 2024-01-25 MED ORDER — ACCU-CHEK SOFTCLIX LANCETS MISC: 100.0000 | Freq: Four times a day (QID) | 12 refills | Status: AC

## 2024-01-25 MED ORDER — ACCU-CHEK GUIDE W/DEVICE KIT: 1.0000 | PACK | Freq: Four times a day (QID) | 0 refills | Status: AC

## 2024-01-25 MED ORDER — ACCU-CHEK GUIDE TEST VI STRP: 1.0000 | ORAL_STRIP | Freq: Four times a day (QID) | 12 refills | Status: AC

## 2024-01-25 NOTE — Progress Notes (Signed)
 After review, MFM consult with provider is not indicated for today  Arna Ranks, MD 01/25/2024 1:18 PM  [facility]

## 2024-01-30 NOTE — Progress Notes (Unsigned)
 Class start Time: 0914   Class End Time: 1046  This was a class of four patients. Pt arrived late.   Patient was seen on 01/31/2014 for Gestational Diabetes self-management class at the [facility]. The following learning objectives were met by the patient during this course:  States the definition of Gestational Diabetes States why dietary management is important in controlling blood glucose Describes the effects each nutrient has on blood glucose levels Demonstrates ability to create a balanced meal plan-instructed by [employer] intern Chloe Demonstrates carbohydrate counting  States when to check blood glucose levels Demonstrates proper blood glucose monitoring techniques States the effect of stress and exercise on blood glucose levels States the importance of limiting caffeine and abstaining from alcohol and smoking   Accu Chek- Patient has a meter prior to visit. Patient is instructed to begin testing pre breakfast and 2 hours after each meal.  Blood glucose today in class 103 mg/dL, reported as 2 hour post prandial per Pt  Patient instructed to monitor glucose levels:  QID FBS: 60 - <95 2 hour: <120  *Patient received handouts: Nutrition Diabetes and Pregnancy Carbohydrate Counting List Blood glucose log Snack ideas for diabetes during pregnancy Plate Planner  Patient will be seen for follow-up as needed.

## 2024-02-01 ENCOUNTER — Encounter: Attending: Obstetrics and Gynecology | Admitting: Dietician

## 2024-02-01 DIAGNOSIS — O24410 Gestational diabetes mellitus in pregnancy, diet controlled: Secondary | ICD-10-CM | POA: Diagnosis present

## 2024-02-07 ENCOUNTER — Encounter: Payer: Self-pay | Admitting: Obstetrics & Gynecology

## 2024-02-14 ENCOUNTER — Ambulatory Visit: Admitting: Obstetrics and Gynecology

## 2024-02-14 ENCOUNTER — Encounter: Payer: Self-pay | Admitting: Obstetrics and Gynecology

## 2024-02-14 VITALS — BP 120/72 | HR 99 | Wt 181.0 lb

## 2024-02-14 DIAGNOSIS — Z8759 Personal history of other complications of pregnancy, childbirth and the puerperium: Secondary | ICD-10-CM

## 2024-02-14 DIAGNOSIS — D279 Benign neoplasm of unspecified ovary: Secondary | ICD-10-CM | POA: Insufficient documentation

## 2024-02-14 DIAGNOSIS — D563 Thalassemia minor: Secondary | ICD-10-CM

## 2024-02-14 DIAGNOSIS — D509 Iron deficiency anemia, unspecified: Secondary | ICD-10-CM | POA: Insufficient documentation

## 2024-02-14 DIAGNOSIS — R87610 Atypical squamous cells of undetermined significance on cytologic smear of cervix (ASC-US): Secondary | ICD-10-CM

## 2024-02-14 DIAGNOSIS — O24414 Gestational diabetes mellitus in pregnancy, insulin controlled: Secondary | ICD-10-CM

## 2024-02-14 DIAGNOSIS — Z3A31 31 weeks gestation of pregnancy: Secondary | ICD-10-CM

## 2024-02-14 DIAGNOSIS — Z3480 Encounter for supervision of other normal pregnancy, unspecified trimester: Secondary | ICD-10-CM

## 2024-02-14 MED ORDER — FERROUS SULFATE 325 (65 FE) MG PO TABS: 325.0000 mg | ORAL_TABLET | ORAL | 1 refills | Status: AC

## 2024-02-14 MED ORDER — PEN NEEDLES 33G X 4 MM MISC: 1 refills | Status: AC

## 2024-02-14 MED ORDER — INSULIN GLARGINE 100 UNIT/ML SOLOSTAR PEN: 6.0000 [IU] | PEN_INJECTOR | Freq: Every morning | SUBCUTANEOUS | 0 refills | Status: AC

## 2024-02-22 ENCOUNTER — Encounter: Payer: Self-pay | Admitting: Obstetrics & Gynecology
